# Patient Record
Sex: Male | Born: 1995 | Race: White | Hispanic: No | Marital: Single | Smoking: Former smoker
Health system: Southern US, Community
[De-identification: ages and names within clinical notes are randomized; demographics above are authoritative.]

## PROBLEM LIST (undated history)

## (undated) ENCOUNTER — Ambulatory Visit: Payer: BC Managed Care – PPO | Source: Home / Self Care

## (undated) DIAGNOSIS — F419 Anxiety disorder, unspecified: Secondary | ICD-10-CM

## (undated) DIAGNOSIS — F10232 Alcohol dependence with withdrawal with perceptual disturbance: Secondary | ICD-10-CM

## (undated) DIAGNOSIS — R011 Cardiac murmur, unspecified: Secondary | ICD-10-CM

## (undated) DIAGNOSIS — F909 Attention-deficit hyperactivity disorder, unspecified type: Secondary | ICD-10-CM

## (undated) DIAGNOSIS — R159 Full incontinence of feces: Secondary | ICD-10-CM

## (undated) DIAGNOSIS — F10259 Alcohol dependence with alcohol-induced psychotic disorder, unspecified: Secondary | ICD-10-CM

## (undated) DIAGNOSIS — F1994 Other psychoactive substance use, unspecified with psychoactive substance-induced mood disorder: Secondary | ICD-10-CM

## (undated) DIAGNOSIS — I350 Nonrheumatic aortic (valve) stenosis: Secondary | ICD-10-CM

## (undated) HISTORY — DX: Cardiac murmur, unspecified: R01.1

## (undated) HISTORY — PX: NASAL SEPTUM SURGERY: SHX37

## (undated) HISTORY — PX: CARDIAC SURGERY: SHX584

## (undated) HISTORY — DX: Full incontinence of feces: R15.9

## (undated) HISTORY — DX: Nonrheumatic aortic (valve) stenosis: I35.0

---
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
## 2016-01-25 DIAGNOSIS — Z952 Presence of prosthetic heart valve: Secondary | ICD-10-CM | POA: Diagnosis not present

## 2016-03-01 DIAGNOSIS — F4310 Post-traumatic stress disorder, unspecified: Secondary | ICD-10-CM | POA: Diagnosis not present

## 2016-03-01 DIAGNOSIS — F909 Attention-deficit hyperactivity disorder, unspecified type: Secondary | ICD-10-CM | POA: Diagnosis not present

## 2016-03-01 DIAGNOSIS — F411 Generalized anxiety disorder: Secondary | ICD-10-CM | POA: Diagnosis not present

## 2016-03-01 DIAGNOSIS — F331 Major depressive disorder, recurrent, moderate: Secondary | ICD-10-CM | POA: Diagnosis not present

## 2016-03-22 DIAGNOSIS — Z952 Presence of prosthetic heart valve: Secondary | ICD-10-CM | POA: Diagnosis not present

## 2016-04-03 DIAGNOSIS — Z952 Presence of prosthetic heart valve: Secondary | ICD-10-CM | POA: Diagnosis not present

## 2016-04-03 DIAGNOSIS — I359 Nonrheumatic aortic valve disorder, unspecified: Secondary | ICD-10-CM | POA: Diagnosis not present

## 2016-04-03 DIAGNOSIS — R002 Palpitations: Secondary | ICD-10-CM | POA: Diagnosis not present

## 2016-04-18 DIAGNOSIS — Z952 Presence of prosthetic heart valve: Secondary | ICD-10-CM | POA: Diagnosis not present

## 2016-06-13 DIAGNOSIS — I359 Nonrheumatic aortic valve disorder, unspecified: Secondary | ICD-10-CM | POA: Diagnosis not present

## 2016-07-12 DIAGNOSIS — F411 Generalized anxiety disorder: Secondary | ICD-10-CM | POA: Diagnosis not present

## 2016-07-12 DIAGNOSIS — F909 Attention-deficit hyperactivity disorder, unspecified type: Secondary | ICD-10-CM | POA: Diagnosis not present

## 2016-07-12 DIAGNOSIS — F331 Major depressive disorder, recurrent, moderate: Secondary | ICD-10-CM | POA: Diagnosis not present

## 2016-07-12 DIAGNOSIS — F4310 Post-traumatic stress disorder, unspecified: Secondary | ICD-10-CM | POA: Diagnosis not present

## 2016-09-13 DIAGNOSIS — I359 Nonrheumatic aortic valve disorder, unspecified: Secondary | ICD-10-CM | POA: Diagnosis not present

## 2016-09-17 ENCOUNTER — Encounter (HOSPITAL_COMMUNITY): Payer: Self-pay

## 2016-09-17 ENCOUNTER — Emergency Department (HOSPITAL_COMMUNITY): Payer: BLUE CROSS/BLUE SHIELD

## 2016-09-17 ENCOUNTER — Emergency Department (HOSPITAL_COMMUNITY)
Admission: EM | Admit: 2016-09-17 | Discharge: 2016-09-18 | Disposition: A | Discharge status: Home / Self Care | Payer: BLUE CROSS/BLUE SHIELD | Attending: Emergency Medicine | Admitting: Emergency Medicine

## 2016-09-17 DIAGNOSIS — Z7901 Long term (current) use of anticoagulants: Secondary | ICD-10-CM | POA: Diagnosis not present

## 2016-09-17 DIAGNOSIS — Z79899 Other long term (current) drug therapy: Secondary | ICD-10-CM | POA: Insufficient documentation

## 2016-09-17 DIAGNOSIS — R0602 Shortness of breath: Secondary | ICD-10-CM | POA: Insufficient documentation

## 2016-09-17 DIAGNOSIS — R079 Chest pain, unspecified: Secondary | ICD-10-CM | POA: Diagnosis not present

## 2016-09-17 DIAGNOSIS — R05 Cough: Secondary | ICD-10-CM | POA: Diagnosis not present

## 2016-09-17 DIAGNOSIS — F17210 Nicotine dependence, cigarettes, uncomplicated: Secondary | ICD-10-CM | POA: Diagnosis not present

## 2016-09-17 DIAGNOSIS — R002 Palpitations: Secondary | ICD-10-CM

## 2016-09-17 DIAGNOSIS — R200 Anesthesia of skin: Secondary | ICD-10-CM | POA: Insufficient documentation

## 2016-09-17 LAB — CBC
HCT: 42.7 % (ref 39.0–52.0)
Hemoglobin: 15.4 g/dL (ref 13.0–17.0)
MCH: 31.7 pg (ref 26.0–34.0)
MCHC: 36.1 g/dL — ABNORMAL HIGH (ref 30.0–36.0)
MCV: 87.9 fL (ref 78.0–100.0)
PLATELETS: 274 10*3/uL (ref 150–400)
RBC: 4.86 MIL/uL (ref 4.22–5.81)
RDW: 12.6 % (ref 11.5–15.5)
WBC: 10.1 10*3/uL (ref 4.0–10.5)

## 2016-09-17 LAB — BASIC METABOLIC PANEL
ANION GAP: 10 (ref 5–15)
BUN: 8 mg/dL (ref 6–20)
CALCIUM: 9.7 mg/dL (ref 8.9–10.3)
CO2: 24 mmol/L (ref 22–32)
CREATININE: 1.03 mg/dL (ref 0.61–1.24)
Chloride: 104 mmol/L (ref 101–111)
Glucose, Bld: 81 mg/dL (ref 65–99)
Potassium: 4 mmol/L (ref 3.5–5.1)
SODIUM: 138 mmol/L (ref 135–145)

## 2016-09-17 LAB — I-STAT TROPONIN, ED: TROPONIN I, POC: 0 ng/mL (ref 0.00–0.08)

## 2016-09-17 LAB — PROTIME-INR
INR: 2.2
Prothrombin Time: 24.8 seconds — ABNORMAL HIGH (ref 11.4–15.2)

## 2016-09-17 NOTE — ED Triage Notes (Signed)
Pt states he was at work and began having palpitations. Pt also reports some chest pain on the way to the hospital. 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 Skin warm and dry, no distress noted.

## 2016-09-17 NOTE — ED Notes (Signed)
MD at bedside. 

## 2016-09-17 NOTE — ED Provider Notes (Signed)
[facility] Provider Note   CSN: [id number] Arrival date & time: 09/17/16  1747  By signing my name below, I, Ny'Kea Lewis, attest that this documentation has been prepared under the direction and in the presence of Danial Sisley, MD. Electronically Signed: Karren CobbleNy'Kea Lewis, ED Scribe. 09/17/16. 11:36 PM.  History   Chief Complaint Chief Complaint  Patient presents with  . Palpitations   The history is provided by the patient and a parent. No language interpreter was used.  Palpitations   This is a recurrent problem. The current episode started 6 to 12 hours ago. The problem occurs rarely. The problem has not changed since onset.The problem is associated with an unknown factor. Associated symptoms include numbness and shortness of breath. Pertinent negatives include no diaphoresis and no weakness. He has tried nothing for the symptoms. The treatment provided significant relief. Risk factors include being male.    HPI HPI Comments: Adam Bray is a 21 y.o. male with a PMHx of aortic stenosis, heart murmur, and anxiety who presents to the Emergency Department complaining of sudden onset, persistent palpitations that began at 5 pm. He notes associated shortness of breath, cough, and numbness in his bilateral upper extremities. Pt reports he was at work leaving a meeting when he began to experience palpitations. He reports having palpitations intermittently for the past three weeks but this episode was not similar. He describes the palpitations felt like "I was getting th blood sucked out of me ". No h/o PE/DVT or recent long travel. Pt reports at this time all his symptoms have resolved. Denies rash, diaphoresis, or any acute associated symptoms.   Past Medical History:  Diagnosis Date  . ADHD (attention deficit hyperactivity disorder)   . 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 Anxiety   .  Aortic stenosis   . Constipation   . 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 Heart murmur   . Substance induced mood disorder (HCC) 10/04/2014    Patient Active Problem List   Diagnosis Date Noted  . 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 Substance induced mood disorder (HCC) 10/04/2014  . MDD (major depressive disorder), recurrent episode, moderate (HCC) 10/04/2014   Past Surgical History:  Procedure Laterality Date  . CARDIAC SURGERY    . NASAL SEPTUM SURGERY      Home Medications    Prior to Admission medications   Medication Sig Start Date End Date Taking? Authorizing Provider  acetaminophen (TYLENOL) 500 MG tablet Take 1,000 mg by mouth daily as needed for headache.    [provider]  cetirizine (ZYRTEC) 10 MG tablet Take 10 mg by mouth daily as needed for allergies.     [provider]  guanFACINE (INTUNIV) 1 MG TB24 Take 3 mg by mouth at bedtime.    [provider]  hydrOXYzine (ATARAX/VISTARIL) 25 MG tablet Take 25 mg by mouth at bedtime.     [provider]  mirtazapine (REMERON) 30 MG tablet Take 30 mg by mouth at bedtime.  04/18/10   [provider]  warfarin (COUMADIN) 2.5 MG tablet Take 2.5 mg by mouth at bedtime.    [provider]   Family History Family History  Problem Relation Age of Onset  . Hirschsprung's disease Neg Hx    Social History Social History  Substance Use Topics  . Smoking status: Current Every Day Smoker  Packs/day: 0.25    Types: Cigarettes  . Smokeless tobacco: Never Used  . Alcohol use Yes    Allergies   Patient has no known allergies.  Review of Systems Review of Systems  Constitutional: Negative for diaphoresis.  Respiratory: Positive for shortness of breath.   Cardiovascular: Positive for palpitations.  Skin: Negative for rash.  Neurological: Positive for numbness. Negative for tremors,  seizures, syncope, speech difficulty and weakness.  All other systems reviewed and are negative.  Physical Exam Updated Vital Signs BP 123/78 (BP Location: Right Arm)   Pulse 85   Temp 98.3 F (36.8 C) (Oral)   Resp 18   Ht 5\' 10"  (1.778 m)   Wt 215 lb (97.5 kg)   SpO2 97%   BMI 30.85 kg/m   Physical Exam  Constitutional: He is oriented to person, place, and time. He appears well-developed and well-nourished.  HENT:  Head: Normocephalic.  Mouth/Throat: Oropharynx is clear and moist. No oropharyngeal exudate.  Eyes: Pupils are equal, round, and reactive to light. Conjunctivae and EOM are normal. Right eye exhibits no discharge. Left eye exhibits no discharge. No scleral icterus.  Neck: Normal range of motion. Neck supple. No JVD present. No tracheal deviation present.  Trachea is midline. No stridor or carotid bruits.  Cardiovascular: Normal rate, regular rhythm, normal heart sounds and intact distal pulses.   No murmur heard. Pulmonary/Chest: Effort normal and breath sounds normal. No stridor. No respiratory distress. He has no wheezes. He has no rales.  Lungs CTA bilaterally.  Abdominal: Soft. Bowel sounds are normal. He exhibits no distension. There is no tenderness. There is no rebound and no guarding.  Musculoskeletal: Normal range of motion. He exhibits no edema or tenderness.  All compartments are soft. No palpable cords.   Lymphadenopathy:    He has no cervical adenopathy.  Neurological: He is alert and oriented to person, place, and time. He has normal reflexes. He displays normal reflexes.  Skin: Skin is warm and dry. Capillary refill takes less than 2 seconds.  Psychiatric: He has a normal mood and affect. His behavior is normal.  Nursing note and vitals reviewed.  ED Treatments / Results  DIAGNOSTIC STUDIES: Oxygen Saturation is 97% on RA, adequate  by my interpretation.   COORDINATION OF CARE: 11:30 PM-Discussed next steps with pt. Pt verbalized understanding  and is agreeable with the plan.   Labs (all labs ordered are listed, but only abnormal results are displayed)  Results for orders placed or performed during the hospital encounter of 09/17/16  Basic metabolic panel  Result Value Ref Range   Sodium 138 135 - 145 mmol/L   Potassium 4.0 3.5 - 5.1 mmol/L   Chloride 104 101 - 111 mmol/L   CO2 24 22 - 32 mmol/L   Glucose, Bld 81 65 - 99 mg/dL   BUN 8 6 - 20 mg/dL   Creatinine, Ser 4.541.03 0.61 - 1.24 mg/dL   Calcium 9.7 8.9 - 09.810.3 mg/dL   GFR calc non Af Amer >60 >60 mL/min   GFR calc Af Amer >60 >60 mL/min   Anion gap 10 5 - 15  CBC  Result Value Ref Range   WBC 10.1 4.0 - 10.5 K/uL   RBC 4.86 4.22 - 5.81 MIL/uL   Hemoglobin 15.4 13.0 - 17.0 g/dL   HCT 11.942.7 14.739.0 - 82.952.0 %   MCV 87.9 78.0 - 100.0 fL   MCH 31.7 26.0 - 34.0 pg   MCHC 36.1 (H) 30.0 - 36.0 g/dL  RDW 12.6 11.5 - 15.5 %   Platelets 274 150 - 400 K/uL  Protime-INR (order if Patient is taking Coumadin / Warfarin)  Result Value Ref Range   Prothrombin Time 24.8 (H) 11.4 - 15.2 seconds   INR 2.20   I-stat troponin, ED  Result Value Ref Range   Troponin i, poc 0.00 0.00 - 0.08 ng/mL   Comment 3           Dg Chest 2 View  Result Date: 09/17/2016 CLINICAL DATA:  21 year old male with chest pain. EXAM: CHEST  2 VIEW COMPARISON:  Chest radiograph dated 03/17/2013 FINDINGS: The lungs are clear. There is no pleural effusion or pneumothorax. The cardiac silhouette is within normal limits. Median sternotomy wires and aortic valve replacement noted. No acute osseous pathology. IMPRESSION: No active cardiopulmonary disease. Electronically Signed   By: Elgie Collard M.D.   On: 09/17/2016 18:59    EKG  Date: 09/17/2016  Rate: 83  Rhythm: normal sinus rhythm  QRS Axis: normal  Intervals: normal  ST/T Wave abnormalities: normal  Conduction Disutrbances: none  Narrative Interpretation: unremarkable     Procedures Procedures (including critical care time)    Final  Clinical Impressions(s) / ED Diagnoses  Palpitations:  Strict return precautions given for weakness, difficulty ambulating, inability to tolerate oral medication,  Dyspnea on exertion, fevers, vomiting, decreased urine output, leg swelling, syncope or any concerns. No signs of systemic illness or infection. The patient is nontoxic-appearing on exam and vital signs are within normal limits. Follow up with your cardiologist.  I have reviewed the triage vital signs and the nursing notes. Pertinent labs &imaging results that were available during my care of the patient were reviewed by me and considered in my medical decision making (see chart for details).  After history, exam, and medical workup I feel the patient has been appropriately medically screened and is safe for discharge home. Pertinent diagnoses were discussed with the patient. Patient was given return precautions.  I personally performed the services described in this documentation, which was scribed in my presence. The recorded information has been reviewed and is accurate.       Charelle Petrakis, MD 09/17/16 2345

## 2016-09-18 ENCOUNTER — Encounter (HOSPITAL_COMMUNITY): Payer: Self-pay | Admitting: Emergency Medicine

## 2016-09-18 DIAGNOSIS — R002 Palpitations: Secondary | ICD-10-CM | POA: Diagnosis not present

## 2016-09-18 LAB — I-STAT TROPONIN, ED: Troponin i, poc: 0.01 ng/mL (ref 0.00–0.08)

## 2016-09-18 NOTE — ED Notes (Signed)
Patient verbalized understanding of discharge instructions and denies any further needs or questions at this time. VS stable. Patient ambulatory with steady gait.  

## 2016-11-08 DIAGNOSIS — I6529 Occlusion and stenosis of unspecified carotid artery: Secondary | ICD-10-CM | POA: Diagnosis not present

## 2016-11-22 DIAGNOSIS — F411 Generalized anxiety disorder: Secondary | ICD-10-CM | POA: Diagnosis not present

## 2016-11-22 DIAGNOSIS — F331 Major depressive disorder, recurrent, moderate: Secondary | ICD-10-CM | POA: Diagnosis not present

## 2016-11-22 DIAGNOSIS — F4310 Post-traumatic stress disorder, unspecified: Secondary | ICD-10-CM | POA: Diagnosis not present

## 2016-11-22 DIAGNOSIS — F909 Attention-deficit hyperactivity disorder, unspecified type: Secondary | ICD-10-CM | POA: Diagnosis not present

## 2016-11-29 ENCOUNTER — Encounter (HOSPITAL_COMMUNITY): Payer: Self-pay | Admitting: Emergency Medicine

## 2016-11-29 ENCOUNTER — Ambulatory Visit (INDEPENDENT_AMBULATORY_CARE_PROVIDER_SITE_OTHER): Payer: BLUE CROSS/BLUE SHIELD

## 2016-11-29 ENCOUNTER — Ambulatory Visit (HOSPITAL_COMMUNITY)
Admission: EM | Admit: 2016-11-29 | Discharge: 2016-11-29 | Disposition: A | Discharge status: Home / Self Care | Payer: BLUE CROSS/BLUE SHIELD | Attending: Family Medicine | Admitting: Family Medicine

## 2016-11-29 DIAGNOSIS — S61012A Laceration without foreign body of left thumb without damage to nail, initial encounter: Secondary | ICD-10-CM | POA: Diagnosis not present

## 2016-11-29 DIAGNOSIS — S60419A Abrasion of unspecified finger, initial encounter: Secondary | ICD-10-CM | POA: Diagnosis not present

## 2016-11-29 DIAGNOSIS — R42 Dizziness and giddiness: Secondary | ICD-10-CM | POA: Diagnosis not present

## 2016-11-29 DIAGNOSIS — Z23 Encounter for immunization: Secondary | ICD-10-CM

## 2016-11-29 DIAGNOSIS — W268XXA Contact with other sharp object(s), not elsewhere classified, initial encounter: Secondary | ICD-10-CM

## 2016-11-29 LAB — GLUCOSE, CAPILLARY: GLUCOSE-CAPILLARY: 91 mg/dL (ref 65–99)

## 2016-11-29 MED ORDER — TETANUS-DIPHTH-ACELL PERTUSSIS 5-2.5-18.5 LF-MCG/0.5 IM SUSP
INTRAMUSCULAR | Status: AC
  Filled 2016-11-29: qty 0.5

## 2016-11-29 MED ORDER — TETANUS-DIPHTH-ACELL PERTUSSIS 5-2.5-18.5 LF-MCG/0.5 IM SUSP
0.5000 mL | Freq: Once | INTRAMUSCULAR | Status: AC
  Administered 2016-11-29: 0.5 mL via INTRAMUSCULAR

## 2016-11-29 NOTE — ED Triage Notes (Addendum)
Laceration to left thumb that occurred today.  Was trying to assist in putting a tarp on a roof, thumb was either cut by nail or siding.  Says a neighbor did remove a piece of metal from thumb.  Unknown when last tetanus was.

## 2016-11-29 NOTE — ED Notes (Signed)
Provided coke, peanut butter and crackers to patient

## 2016-11-29 NOTE — Discharge Instructions (Signed)
Tetanus injection today. Wound dressing daily. Monitor for any worsening of symptoms, spreading redness, increased warmth, drainage, follow up here or with PCP for further evaluation.

## 2016-11-29 NOTE — ED Provider Notes (Signed)
[facility]    CSN: [id number] Arrival date & time: 11/29/16  1755     History   Chief Complaint Chief Complaint  Patient presents with  . Laceration    HPI Adam Bray is a 21 y.o. male.   21 year old male with history of aortic stenosis s/p valve replacement 2014 on warfarin, anxiety, alcohol induced psychotic disorder, substance induced mood disorder, MDD, comes in for few hour history of laceration to left thumb. He was trying to assist in putting a tarp on the roof when thumb was cut. States a piece of metal was removed from the thumb, but feels that there could be more foreign body in the wound. Unknown last tetanus injection. Denies numbness/tingling, fever, chills.       Past Medical History:  Diagnosis Date  . ADHD (attention deficit hyperactivity disorder)   . 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 Anxiety   . Aortic stenosis   . Constipation   . 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 Heart murmur   . Substance induced mood disorder (HCC) 10/04/2014    Patient Active Problem List   Diagnosis Date Noted  . 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 Substance induced mood disorder (HCC) 10/04/2014  . MDD (major depressive disorder), recurrent episode, moderate (HCC) 10/04/2014    Past Surgical History:  Procedure Laterality Date  . CARDIAC SURGERY    . NASAL SEPTUM SURGERY         Home Medications    Prior to Admission medications   Medication Sig Start Date End Date Taking? Authorizing Provider  GuanFACINE HCl (INTUNIV) 3 MG TB24 Take 3 mg by mouth at bedtime.    [provider]  mirtazapine (REMERON) 45 MG tablet Take 45 mg by mouth at bedtime.    [provider]  warfarin (COUMADIN) 3 MG tablet Take 3 mg by mouth See admin instructions. On Sunday,  Tuesday, Thursday and Saturday    [provider]  warfarin (COUMADIN) 4 MG tablet Take 4 mg by mouth every Monday, Wednesday, and Friday.    [provider]    Family History Family History  Problem Relation Age of Onset  . Hirschsprung's disease Neg Hx     Social History Social History  Substance Use Topics  . Smoking status: Current Every Day Smoker    Packs/day: 0.25    Types: Cigarettes  . Smokeless tobacco: Never Used  . Alcohol use Yes     Allergies   Patient has no known allergies.   Review of Systems Review of Systems  Reason unable to perform ROS: See HPI as above.     Physical Exam Triage Vital Signs ED Triage Vitals  Enc Vitals Group     BP 11/29/16 1806 127/76     Pulse Rate 11/29/16 1806 87     Resp 11/29/16 1806 20     Temp 11/29/16 1806 98.5 F (36.9 C)     Temp Source 11/29/16 1806 Oral     SpO2 11/29/16 1806 97 %     Weight --      Height --      Head Circumference --      Peak Flow --      Pain Score 11/29/16 1804 2     Pain Loc --      Pain Edu? --  Excl. in GC? --    No data found.   Updated Vital Signs BP 127/76 (BP Location: Right Arm)   Pulse 87   Temp 98.5 F (36.9 C) (Oral)   Resp 20   SpO2 97%   Physical Exam  Constitutional: He is oriented to person, place, and time. He appears well-developed and well-nourished. No distress.  HENT:  Head: Normocephalic and atraumatic.  Eyes: Pupils are equal, round, and reactive to light. Conjunctivae are normal.  Musculoskeletal:  1.5 cm x 0.5 cm abrasion on left thumb flexor surface of DIP joint. Tenderness on palpation. Full ROM of thumb. Sensation intact. Cap refill <2s.   See picture below.   Neurological: He is alert and oriented to person, place, and time.        UC Treatments / Results  Labs (all labs ordered are listed, but only abnormal results are displayed) Labs Reviewed  GLUCOSE, CAPILLARY    EKG  EKG Interpretation None        Radiology Dg Finger Thumb Left  Result Date: 11/29/2016 CLINICAL DATA:  Was trying a move a downed tree and caught LEFT thumb on a nail, laceration, question foreign body EXAM: LEFT THUMB 2+V COMPARISON:  LEFT hand radiographs 07/10/2008 FINDINGS: Osseous mineralization normal. Joint spaces preserved. No acute fracture, dislocation or bone destruction. No radiopaque foreign body. IMPRESSION: No acute abnormalities. Electronically Signed   By: Ulyses Southward M.D.   On: 11/29/2016 18:43    Procedures Procedures (including critical care time)  Medications Ordered in UC Medications  Tdap (BOOSTRIX) injection 0.5 mL (0.5 mLs Intramuscular Given 11/29/16 1908)     Initial Impression / Assessment and Plan / UC Course  I have reviewed the triage vital signs and the nursing notes.  Pertinent labs & imaging results that were available during my care of the patient were reviewed by me and considered in my medical decision making (see chart for details).  Clinical Course as of Nov 30 1919  Thu Nov 29, 2016  1610 Patient feeling faint after cleaning of wound and dressing. Pale with diaphoresis. BP 94/55, HR 63. Patient states last intake of food 1pm with cereal. Has not had anything else to eat today. CBG 91. Mother states patient can get weak/dizzy after seeing blood due to past experience. Food and drink provided, patient is feeling better. Will recheck vitals after food/drink intake  [AY]    Clinical Course User Index [AY] Belinda Fisher, PA-C   Patient feeling better after food and drink intake. Xray without metal foreign body noted. Extensive irrigation with saline, no obvious foreign body seen. Given warfarin use, will apply surgicel to help control bleeding. Wound dressed. Wound care instructions given. Tetanus injection in office today. Return precautions given.     Final Clinical Impressions(s) / UC Diagnoses   Final diagnoses:  Abrasion of finger, initial encounter    New  Prescriptions New Prescriptions   No medications on file      Lurline Idol 11/29/16 1920    Belinda Fisher, PA-C 11/29/16 1922

## 2016-11-29 NOTE — ED Notes (Signed)
Soaking left thumb in saline/betadine

## 2017-01-19 DIAGNOSIS — I359 Nonrheumatic aortic valve disorder, unspecified: Secondary | ICD-10-CM | POA: Diagnosis not present

## 2017-02-09 DIAGNOSIS — I359 Nonrheumatic aortic valve disorder, unspecified: Secondary | ICD-10-CM | POA: Diagnosis not present

## 2017-02-26 DIAGNOSIS — F331 Major depressive disorder, recurrent, moderate: Secondary | ICD-10-CM | POA: Diagnosis not present

## 2017-02-26 DIAGNOSIS — F4310 Post-traumatic stress disorder, unspecified: Secondary | ICD-10-CM | POA: Diagnosis not present

## 2017-02-26 DIAGNOSIS — F411 Generalized anxiety disorder: Secondary | ICD-10-CM | POA: Diagnosis not present

## 2017-02-26 DIAGNOSIS — I359 Nonrheumatic aortic valve disorder, unspecified: Secondary | ICD-10-CM | POA: Diagnosis not present

## 2017-02-26 DIAGNOSIS — F909 Attention-deficit hyperactivity disorder, unspecified type: Secondary | ICD-10-CM | POA: Diagnosis not present

## 2017-03-09 DIAGNOSIS — I359 Nonrheumatic aortic valve disorder, unspecified: Secondary | ICD-10-CM | POA: Diagnosis not present

## 2017-04-03 DIAGNOSIS — Z952 Presence of prosthetic heart valve: Secondary | ICD-10-CM | POA: Diagnosis not present

## 2017-05-04 DIAGNOSIS — I359 Nonrheumatic aortic valve disorder, unspecified: Secondary | ICD-10-CM | POA: Diagnosis not present

## 2017-05-05 ENCOUNTER — Emergency Department (HOSPITAL_COMMUNITY)
Admission: EM | Admit: 2017-05-05 | Discharge: 2017-05-05 | Disposition: A | Discharge status: Home / Self Care | Payer: BLUE CROSS/BLUE SHIELD | Attending: Physician Assistant | Admitting: Physician Assistant

## 2017-05-05 ENCOUNTER — Encounter (HOSPITAL_COMMUNITY): Payer: Self-pay | Admitting: Emergency Medicine

## 2017-05-05 ENCOUNTER — Other Ambulatory Visit: Payer: Self-pay

## 2017-05-05 ENCOUNTER — Emergency Department (HOSPITAL_COMMUNITY): Payer: BLUE CROSS/BLUE SHIELD

## 2017-05-05 DIAGNOSIS — F17210 Nicotine dependence, cigarettes, uncomplicated: Secondary | ICD-10-CM | POA: Insufficient documentation

## 2017-05-05 DIAGNOSIS — R002 Palpitations: Secondary | ICD-10-CM | POA: Diagnosis not present

## 2017-05-05 DIAGNOSIS — Z7901 Long term (current) use of anticoagulants: Secondary | ICD-10-CM | POA: Insufficient documentation

## 2017-05-05 DIAGNOSIS — R079 Chest pain, unspecified: Secondary | ICD-10-CM | POA: Diagnosis not present

## 2017-05-05 LAB — I-STAT TROPONIN, ED: Troponin i, poc: 0.01 ng/mL (ref 0.00–0.08)

## 2017-05-05 LAB — TSH: TSH: 2.782 u[IU]/mL (ref 0.350–4.500)

## 2017-05-05 LAB — CBC
HCT: 43.9 % (ref 39.0–52.0)
Hemoglobin: 15.6 g/dL (ref 13.0–17.0)
MCH: 32.2 pg (ref 26.0–34.0)
MCHC: 35.5 g/dL (ref 30.0–36.0)
MCV: 90.5 fL (ref 78.0–100.0)
PLATELETS: 257 10*3/uL (ref 150–400)
RBC: 4.85 MIL/uL (ref 4.22–5.81)
RDW: 12.7 % (ref 11.5–15.5)
WBC: 10 10*3/uL (ref 4.0–10.5)

## 2017-05-05 LAB — PROTIME-INR
INR: 2.51
PROTHROMBIN TIME: 26.9 s — AB (ref 11.4–15.2)

## 2017-05-05 LAB — BASIC METABOLIC PANEL
Anion gap: 11 (ref 5–15)
BUN: 10 mg/dL (ref 6–20)
CHLORIDE: 105 mmol/L (ref 101–111)
CO2: 23 mmol/L (ref 22–32)
CREATININE: 1.04 mg/dL (ref 0.61–1.24)
Calcium: 9.3 mg/dL (ref 8.9–10.3)
GFR calc non Af Amer: >60 mL/min (ref >60)
Glucose, Bld: 104 mg/dL — ABNORMAL HIGH (ref 65–99)
Potassium: 3.6 mmol/L (ref 3.5–5.1)
SODIUM: 139 mmol/L (ref 135–145)

## 2017-05-05 NOTE — ED Notes (Signed)
Pt has hx of valve replacement 3 years ago, Aortic valve-- congenital defect--  Has had palpitations and heart "racing" since last Friday- not drinking energy drinks/caffienated beverages, no antihistamines-- saw private cardiologist yesterday for reg follow up.

## 2017-05-05 NOTE — ED Triage Notes (Addendum)
Reports having left chest pressure that has been intermittent for the last week.  Also feels like his heart is racing.  Reports feeling sob, dizzy, and some nausea.  Patient reports having a mechanical valve and takes coumadin.

## 2017-05-05 NOTE — Discharge Instructions (Signed)
Please follow up with your doctor °Return if worsening °

## 2017-05-05 NOTE — ED Provider Notes (Signed)
[facility] EMERGENCY DEPARTMENT Provider Note   CSN: [id number] Arrival date & time: 05/05/17  0055     History   Chief Complaint Chief Complaint  Patient presents with  . Chest Pain  . Tachycardia    HPI Adam Bray is a 22 y.o. male who presents with feeling of his heart racing.  Past medical history significant for alcohol abuse, anxiety, congenital aortic stenosis status post mechanical valve replacement on chronic anticoagulation.  Patient states that over the past week he has had intermittent feelings of his heart racing, chest and throat pressure, and shortness of breath.  Last night the feeling was more persistent and occurred more often so he decided to come to the ED.  He denies any aggravating or alleviating factors.  He has had this before and had to wear a Holter monitor but states that it would not stick to him so he did not have this done.  His prior palpitations resolved on their own.  He states that he did smoke marijuana last month and had palpitations at that time but he has not smoked recently.  He is not drinking excessive caffeine or energy drinks or taking any over-the-counter medicines.  He denies fever or recent illness, chest pain, lightheadedness, syncope, abdominal pain, leg pain or swelling.  He currently feels asymptomatic  HPI  Past Medical History:  Diagnosis Date  . ADHD (attention deficit hyperactivity disorder)   . 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 Anxiety   . Aortic stenosis   . Constipation   . 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 Heart murmur   . Substance induced mood disorder (HCC) 10/04/2014    Patient Active Problem List   Diagnosis Date Noted  . 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 Substance induced mood disorder (HCC) 10/04/2014  . MDD  (major depressive disorder), recurrent episode, moderate (HCC) 10/04/2014    Past Surgical History:  Procedure Laterality Date  . CARDIAC SURGERY    . NASAL SEPTUM SURGERY       Home Medications    Prior to Admission medications   Medication Sig Start Date End Date Taking? Authorizing Provider  GuanFACINE HCl (INTUNIV) 3 MG TB24 Take 3 mg by mouth at bedtime.    [provider]  mirtazapine (REMERON) 45 MG tablet Take 45 mg by mouth at bedtime.    [provider]  warfarin (COUMADIN) 3 MG tablet Take 3 mg by mouth See admin instructions. On Sunday, Tuesday, Thursday and Saturday    [provider]  warfarin (COUMADIN) 4 MG tablet Take 4 mg by mouth every Monday, Wednesday, and Friday.    [provider]    Family History Family History  Problem Relation Age of Onset  . Hirschsprung's disease Neg Hx     Social History Social History   Tobacco Use  . Smoking status: Current Every Day Smoker    Packs/day: 0.25    Types: Cigarettes  . Smokeless tobacco: Never Used  Substance Use Topics  . Alcohol use: Yes    Comment: occasionally  . Drug use: No     Allergies   Patient has no known allergies.   Review of Systems Review of Systems  Constitutional: Negative for fever.  Respiratory: Positive for shortness of breath (Resolved). Negative for cough and wheezing.   Cardiovascular: Positive for chest pain (Pressure, resolved)  and palpitations. Negative for leg swelling.  Gastrointestinal: Negative for abdominal pain, nausea and vomiting.  Neurological: Negative for syncope and light-headedness.  All other systems reviewed and are negative.    Physical Exam Updated Vital Signs BP 133/61 (BP Location: Right Arm)   Pulse 94   Temp 98.7 F (37.1 C) (Oral)   Resp 18   Ht 5\' 10"  (1.778 m)   Wt 95.3 kg (210 lb)   SpO2 100%   BMI 30.13 kg/m   Physical Exam  Constitutional: He is oriented to person, place, and time. He appears  well-developed and well-nourished. No distress.  HENT:  Head: Normocephalic and atraumatic.  Eyes: Conjunctivae are normal. Pupils are equal, round, and reactive to light. Right eye exhibits no discharge. Left eye exhibits no discharge. No scleral icterus.  Neck: Normal range of motion.  Cardiovascular: Normal rate and regular rhythm.  Murmur heard. Surgical scar  Pulmonary/Chest: Effort normal and breath sounds normal. No respiratory distress.  Abdominal: Soft. Bowel sounds are normal. He exhibits no distension. There is no tenderness.  Neurological: He is alert and oriented to person, place, and time.  Skin: Skin is warm and dry.  Psychiatric: He has a normal mood and affect. His behavior is normal.  Nursing note and vitals reviewed.    ED Treatments / Results  Labs (all labs ordered are listed, but only abnormal results are displayed) Labs Reviewed  BASIC METABOLIC PANEL - Abnormal; Notable for the following components:      Result Value   Glucose, Bld 104 (*)    All other components within normal limits  PROTIME-INR - Abnormal; Notable for the following components:   Prothrombin Time 26.9 (*)    All other components within normal limits  CBC  TSH  I-STAT TROPONIN, ED    EKG  EKG Interpretation  Date/Time:  Sunday May 05 2017 01:10:57 EDT Ventricular Rate:  90 PR Interval:  162 QRS Duration: 94 QT Interval:  344 QTC Calculation: 420 R Axis:   82 Text Interpretation:  Normal sinus rhythm Nonspecific T wave abnormality Abnormal ECG Normal sinus rhythm Confirmed by Bary CastillaMackuen, Courteney ([id number]) on 05/05/2017 10:32:38 AM       Radiology Dg Chest 2 View  Result Date: 05/05/2017 CLINICAL DATA:  Chest pain. EXAM: CHEST - 2 VIEW COMPARISON:  Radiograph 09/17/2016 FINDINGS: Post median sternotomy with prosthetic aortic valve. The cardiomediastinal contours are normal. The lungs are clear. Pulmonary vasculature is normal. No consolidation, pleural effusion, or pneumothorax.  No acute osseous abnormalities are seen. IMPRESSION: No acute pulmonary process. Electronically Signed   By: Rubye OaksMelanie  Ehinger M.D.   On: 05/05/2017 02:32    Procedures Procedures (including critical care time)  Medications Ordered in ED Medications - No data to display   Initial Impression / Assessment and Plan / ED Course  I have reviewed the triage vital signs and the nursing notes.  Pertinent labs & imaging results that were available during my care of the patient were reviewed by me and considered in my medical decision making (see chart for details).  22 year old male presents with palpitations, chest pressure, shortness of breath intermittently for the past week.  Unclear etiology.  He states that he was having the symptoms on the initial EKG was taken this morning.  This was normal sinus rhythm.  His CBC is normal.  His BMP does not have any electrolyte imbalances.  His troponin was normal.  His chest x-ray is normal.  His INR is therapeutic.  A TSH  was drawn.  He was advised to follow-up with his cardiologist for ongoing symptoms.  Final Clinical Impressions(s) / ED Diagnoses   Final diagnoses:  Palpitations    ED Discharge Orders    None       Bethel Born, PA-C 05/05/17 1229    Abelino Derrick, MD 05/06/17 2257

## 2017-05-17 ENCOUNTER — Encounter (HOSPITAL_COMMUNITY): Payer: Self-pay | Admitting: Family Medicine

## 2017-05-17 ENCOUNTER — Ambulatory Visit (HOSPITAL_COMMUNITY)
Admission: EM | Admit: 2017-05-17 | Discharge: 2017-05-17 | Disposition: A | Discharge status: Home / Self Care | Payer: BLUE CROSS/BLUE SHIELD | Attending: Family Medicine | Admitting: Family Medicine

## 2017-05-17 DIAGNOSIS — J40 Bronchitis, not specified as acute or chronic: Secondary | ICD-10-CM | POA: Diagnosis not present

## 2017-05-17 MED ORDER — AZITHROMYCIN 250 MG PO TABS: 250.0000 mg | ORAL_TABLET | Freq: Once | ORAL | 0 refills | Status: AC

## 2017-05-17 NOTE — ED Triage Notes (Signed)
Pt here for cough and congestion x 3 days. Denies fever. sts his lungs hurt.

## 2017-05-17 NOTE — ED Provider Notes (Signed)
[facility]    CSN: [id number] Arrival date & time: 05/17/17  1213     History   Chief Complaint Chief Complaint  Patient presents with  . Cough    HPI SEICHI KAUFHOLD is a 22 y.o. male.   Complains of productive cough with green sputum and low-grade fever.  Has a history of aortic valve replacement and is on chronic Coumadin.  Also history of tobacco use  HPI  Past Medical History:  Diagnosis Date  . ADHD (attention deficit hyperactivity disorder)   . 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 Anxiety   . Aortic stenosis   . Constipation   . 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 Heart murmur   . Substance induced mood disorder (HCC) 10/04/2014    Patient Active Problem List   Diagnosis Date Noted  . 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 Substance induced mood disorder (HCC) 10/04/2014  . MDD (major depressive disorder), recurrent episode, moderate (HCC) 10/04/2014    Past Surgical History:  Procedure Laterality Date  . CARDIAC SURGERY    . NASAL SEPTUM SURGERY         Home Medications    Prior to Admission medications   Medication Sig Start Date End Date Taking? Authorizing Provider  GuanFACINE HCl (INTUNIV) 3 MG TB24 Take 3 mg by mouth at bedtime.    [provider]  mirtazapine (REMERON) 45 MG tablet Take 45 mg by mouth at bedtime.    [provider]  warfarin (COUMADIN) 3 MG tablet Take 3 mg by mouth See admin instructions. On Sunday, Tuesday, Thursday and Saturday    [provider]  warfarin (COUMADIN) 4 MG tablet Take 4 mg by mouth every Monday, Wednesday, and Friday.    [provider]    Family History Family History  Problem Relation Age of Onset  . Hirschsprung's disease Neg Hx     Social History Social History   Tobacco Use    . Smoking status: Current Every Day Smoker    Packs/day: 0.25    Types: Cigarettes  . Smokeless tobacco: Never Used  Substance Use Topics  . Alcohol use: Yes    Comment: occasionally  . Drug use: No     Allergies   Patient has no known allergies.   Review of Systems Review of Systems   Physical Exam Triage Vital Signs ED Triage Vitals  Enc Vitals Group     BP 05/17/17 1255 107/68     Pulse Rate 05/17/17 1255 89     Resp 05/17/17 1255 18     Temp 05/17/17 1255 98.4 F (36.9 C)     Temp src --      SpO2 05/17/17 1255 96 %     Weight --      Height --      Head Circumference --      Peak Flow --      Pain Score 05/17/17 1254 5     Pain Loc --      Pain Edu? --      Excl. in GC? --    No data found.  Updated Vital Signs BP 107/68   Pulse 89   Temp 98.4 F (36.9 C)   Resp 18   SpO2 96%   Visual Acuity Right Eye Distance:   Left Eye Distance:   Bilateral Distance:  Right Eye Near:   Left Eye Near:    Bilateral Near:     Physical Exam   UC Treatments / Results  Labs (all labs ordered are listed, but only abnormal results are displayed) Labs Reviewed - No data to display  EKG None Radiology No results found.  Procedures Procedures (including critical care time)  Medications Ordered in UC Medications - No data to display   Initial Impression / Assessment and Plan / UC Course  I have reviewed the triage vital signs and the nursing notes.  Pertinent labs & imaging results that were available during my care of the patient were reviewed by me and considered in my medical decision making (see chart for details).     Bronchitis.  Green sputum with low-grade fever and smoker.  Will treat with Z-Pak and also recommended Mucinex DM for the cough.  Final Clinical Impressions(s) / UC Diagnoses   Final diagnoses:  None    ED Discharge Orders    None       Controlled Substance Prescriptions [place] Controlled Substance Registry consulted?  Not Applicable   Frederica KusterMiller, Jordis Repetto M, MD 05/17/17 1335

## 2017-05-21 DIAGNOSIS — F331 Major depressive disorder, recurrent, moderate: Secondary | ICD-10-CM | POA: Diagnosis not present

## 2017-05-21 DIAGNOSIS — F411 Generalized anxiety disorder: Secondary | ICD-10-CM | POA: Diagnosis not present

## 2017-05-21 DIAGNOSIS — F4310 Post-traumatic stress disorder, unspecified: Secondary | ICD-10-CM | POA: Diagnosis not present

## 2017-05-21 DIAGNOSIS — F909 Attention-deficit hyperactivity disorder, unspecified type: Secondary | ICD-10-CM | POA: Diagnosis not present

## 2017-06-05 DIAGNOSIS — R5383 Other fatigue: Secondary | ICD-10-CM | POA: Diagnosis not present

## 2017-06-05 DIAGNOSIS — Z7901 Long term (current) use of anticoagulants: Secondary | ICD-10-CM | POA: Diagnosis not present

## 2017-06-05 DIAGNOSIS — E559 Vitamin D deficiency, unspecified: Secondary | ICD-10-CM | POA: Diagnosis not present

## 2017-06-05 DIAGNOSIS — Z952 Presence of prosthetic heart valve: Secondary | ICD-10-CM | POA: Diagnosis not present

## 2017-06-05 DIAGNOSIS — Z0000 Encounter for general adult medical examination without abnormal findings: Secondary | ICD-10-CM | POA: Diagnosis not present

## 2017-07-06 DIAGNOSIS — I359 Nonrheumatic aortic valve disorder, unspecified: Secondary | ICD-10-CM | POA: Diagnosis not present

## 2017-07-22 DIAGNOSIS — Z0000 Encounter for general adult medical examination without abnormal findings: Secondary | ICD-10-CM | POA: Diagnosis not present

## 2017-07-22 DIAGNOSIS — E559 Vitamin D deficiency, unspecified: Secondary | ICD-10-CM | POA: Diagnosis not present

## 2017-08-15 DIAGNOSIS — F331 Major depressive disorder, recurrent, moderate: Secondary | ICD-10-CM | POA: Diagnosis not present

## 2017-08-15 DIAGNOSIS — F4310 Post-traumatic stress disorder, unspecified: Secondary | ICD-10-CM | POA: Diagnosis not present

## 2017-08-15 DIAGNOSIS — F411 Generalized anxiety disorder: Secondary | ICD-10-CM | POA: Diagnosis not present

## 2017-08-15 DIAGNOSIS — F909 Attention-deficit hyperactivity disorder, unspecified type: Secondary | ICD-10-CM | POA: Diagnosis not present

## 2017-08-17 DIAGNOSIS — I359 Nonrheumatic aortic valve disorder, unspecified: Secondary | ICD-10-CM | POA: Diagnosis not present

## 2017-09-21 DIAGNOSIS — I359 Nonrheumatic aortic valve disorder, unspecified: Secondary | ICD-10-CM | POA: Diagnosis not present

## 2017-10-31 DIAGNOSIS — M795 Residual foreign body in soft tissue: Secondary | ICD-10-CM | POA: Diagnosis not present

## 2017-10-31 DIAGNOSIS — E039 Hypothyroidism, unspecified: Secondary | ICD-10-CM | POA: Diagnosis not present

## 2017-10-31 DIAGNOSIS — E559 Vitamin D deficiency, unspecified: Secondary | ICD-10-CM | POA: Diagnosis not present

## 2017-11-14 DIAGNOSIS — F4310 Post-traumatic stress disorder, unspecified: Secondary | ICD-10-CM | POA: Diagnosis not present

## 2017-11-14 DIAGNOSIS — F411 Generalized anxiety disorder: Secondary | ICD-10-CM | POA: Diagnosis not present

## 2017-11-14 DIAGNOSIS — F331 Major depressive disorder, recurrent, moderate: Secondary | ICD-10-CM | POA: Diagnosis not present

## 2017-11-14 DIAGNOSIS — F909 Attention-deficit hyperactivity disorder, unspecified type: Secondary | ICD-10-CM | POA: Diagnosis not present

## 2017-12-03 DIAGNOSIS — I359 Nonrheumatic aortic valve disorder, unspecified: Secondary | ICD-10-CM | POA: Diagnosis not present

## 2018-01-02 DIAGNOSIS — E039 Hypothyroidism, unspecified: Secondary | ICD-10-CM | POA: Diagnosis not present

## 2018-01-02 DIAGNOSIS — Z7901 Long term (current) use of anticoagulants: Secondary | ICD-10-CM | POA: Diagnosis not present

## 2018-02-13 DIAGNOSIS — F4310 Post-traumatic stress disorder, unspecified: Secondary | ICD-10-CM | POA: Diagnosis not present

## 2018-02-13 DIAGNOSIS — F909 Attention-deficit hyperactivity disorder, unspecified type: Secondary | ICD-10-CM | POA: Diagnosis not present

## 2018-02-13 DIAGNOSIS — F411 Generalized anxiety disorder: Secondary | ICD-10-CM | POA: Diagnosis not present

## 2018-02-13 DIAGNOSIS — F331 Major depressive disorder, recurrent, moderate: Secondary | ICD-10-CM | POA: Diagnosis not present

## 2018-02-17 DIAGNOSIS — R300 Dysuria: Secondary | ICD-10-CM | POA: Diagnosis not present

## 2018-02-17 DIAGNOSIS — I359 Nonrheumatic aortic valve disorder, unspecified: Secondary | ICD-10-CM | POA: Diagnosis not present

## 2018-02-17 DIAGNOSIS — R599 Enlarged lymph nodes, unspecified: Secondary | ICD-10-CM | POA: Diagnosis not present

## 2018-02-17 DIAGNOSIS — L709 Acne, unspecified: Secondary | ICD-10-CM | POA: Diagnosis not present

## 2018-02-18 ENCOUNTER — Other Ambulatory Visit: Payer: Self-pay | Admitting: Family Medicine

## 2018-02-18 DIAGNOSIS — R599 Enlarged lymph nodes, unspecified: Secondary | ICD-10-CM

## 2018-02-27 ENCOUNTER — Other Ambulatory Visit: Payer: BLUE CROSS/BLUE SHIELD

## 2018-02-28 ENCOUNTER — Other Ambulatory Visit: Payer: BLUE CROSS/BLUE SHIELD

## 2018-03-07 ENCOUNTER — Ambulatory Visit
Admission: RE | Admit: 2018-03-07 | Discharge: 2018-03-07 | Disposition: A | Discharge status: Home / Self Care | Payer: BLUE CROSS/BLUE SHIELD | Source: Ambulatory Visit | Attending: Family Medicine | Admitting: Family Medicine

## 2018-03-07 DIAGNOSIS — R599 Enlarged lymph nodes, unspecified: Secondary | ICD-10-CM | POA: Diagnosis not present

## 2018-03-07 DIAGNOSIS — I359 Nonrheumatic aortic valve disorder, unspecified: Secondary | ICD-10-CM | POA: Diagnosis not present

## 2018-03-27 DIAGNOSIS — L709 Acne, unspecified: Secondary | ICD-10-CM | POA: Diagnosis not present

## 2018-03-27 DIAGNOSIS — R599 Enlarged lymph nodes, unspecified: Secondary | ICD-10-CM | POA: Diagnosis not present

## 2018-03-27 DIAGNOSIS — I359 Nonrheumatic aortic valve disorder, unspecified: Secondary | ICD-10-CM | POA: Diagnosis not present

## 2018-04-17 DIAGNOSIS — L700 Acne vulgaris: Secondary | ICD-10-CM | POA: Diagnosis not present

## 2018-04-17 DIAGNOSIS — L718 Other rosacea: Secondary | ICD-10-CM | POA: Diagnosis not present

## 2018-04-25 DIAGNOSIS — I359 Nonrheumatic aortic valve disorder, unspecified: Secondary | ICD-10-CM | POA: Diagnosis not present

## 2018-05-09 DIAGNOSIS — I359 Nonrheumatic aortic valve disorder, unspecified: Secondary | ICD-10-CM | POA: Diagnosis not present

## 2018-05-19 DIAGNOSIS — Z6833 Body mass index (BMI) 33.0-33.9, adult: Secondary | ICD-10-CM | POA: Diagnosis not present

## 2018-05-19 DIAGNOSIS — Z952 Presence of prosthetic heart valve: Secondary | ICD-10-CM | POA: Diagnosis not present

## 2018-05-19 DIAGNOSIS — Q231 Congenital insufficiency of aortic valve: Secondary | ICD-10-CM | POA: Diagnosis not present

## 2018-05-19 DIAGNOSIS — I359 Nonrheumatic aortic valve disorder, unspecified: Secondary | ICD-10-CM | POA: Diagnosis not present

## 2018-05-22 DIAGNOSIS — F331 Major depressive disorder, recurrent, moderate: Secondary | ICD-10-CM | POA: Diagnosis not present

## 2018-05-22 DIAGNOSIS — F411 Generalized anxiety disorder: Secondary | ICD-10-CM | POA: Diagnosis not present

## 2018-05-22 DIAGNOSIS — F909 Attention-deficit hyperactivity disorder, unspecified type: Secondary | ICD-10-CM | POA: Diagnosis not present

## 2018-05-22 DIAGNOSIS — F4310 Post-traumatic stress disorder, unspecified: Secondary | ICD-10-CM | POA: Diagnosis not present

## 2018-05-23 IMAGING — DX DG FINGER THUMB 2+V*L*
3 series · 3 of 3 positions shown · non-contrast
Comparison: LEFT hand radiographs 07/10/2008

CLINICAL DATA: Was trying a move a downed tree and caught LEFT
thumb on a nail, laceration, question foreign body

EXAM:
LEFT THUMB 2+V

[finger ap]
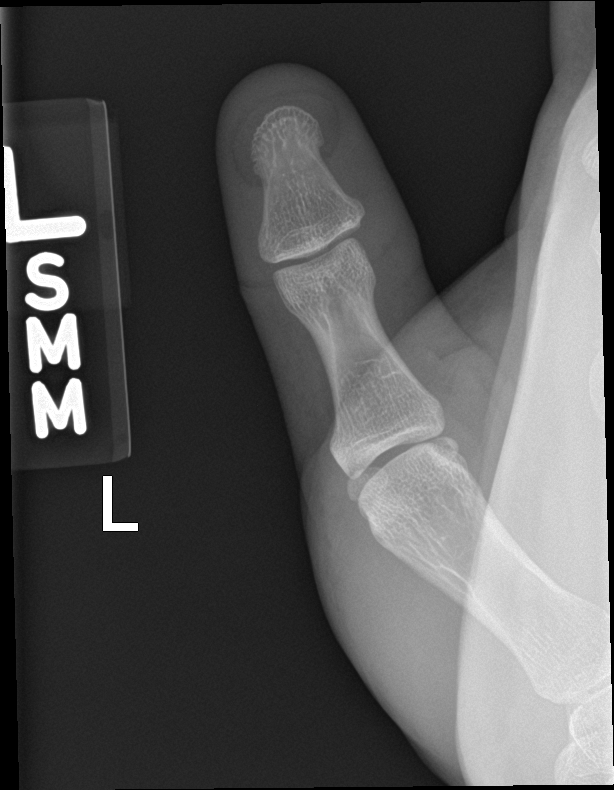

[finger obl]
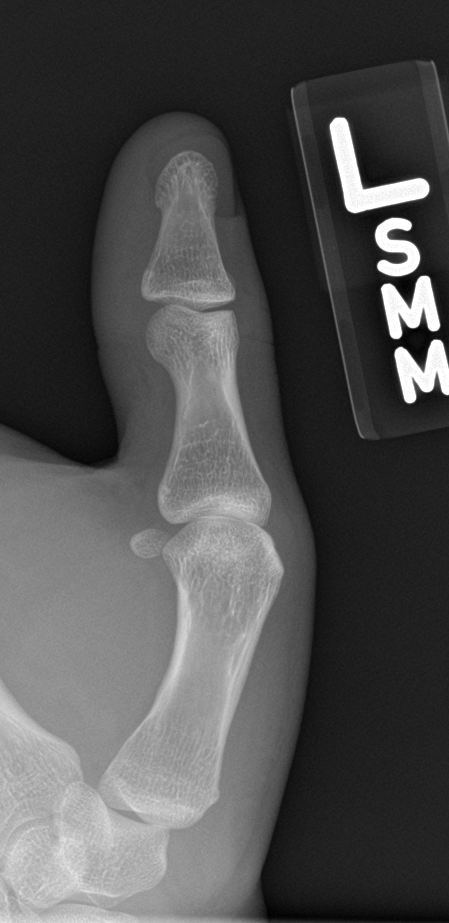

[finger lat]
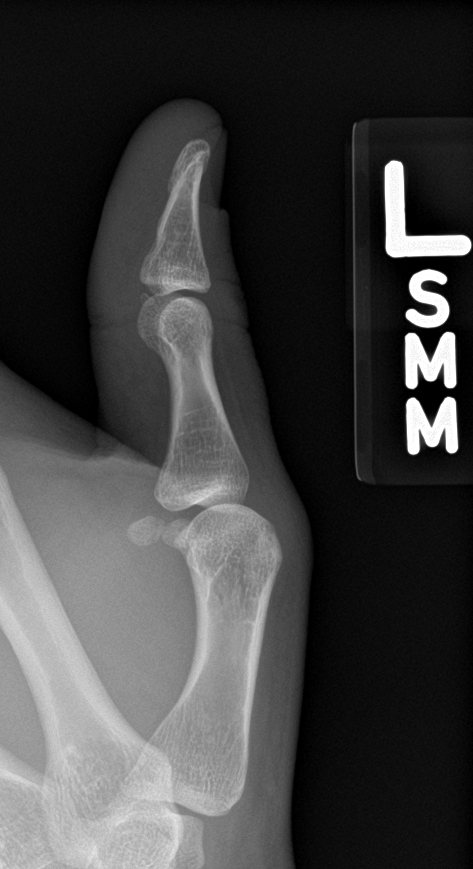

[3 of 3 positions shown; findings below may reference images not displayed]

FINDINGS: Osseous mineralization normal.

Joint spaces preserved.

No acute fracture, dislocation or bone destruction.

No radiopaque foreign body.
IMPRESSION: No acute abnormalities.

## 2018-05-30 DIAGNOSIS — I359 Nonrheumatic aortic valve disorder, unspecified: Secondary | ICD-10-CM | POA: Diagnosis not present

## 2018-06-26 DIAGNOSIS — I359 Nonrheumatic aortic valve disorder, unspecified: Secondary | ICD-10-CM | POA: Diagnosis not present

## 2018-07-10 DIAGNOSIS — I359 Nonrheumatic aortic valve disorder, unspecified: Secondary | ICD-10-CM | POA: Diagnosis not present

## 2018-08-04 DIAGNOSIS — I359 Nonrheumatic aortic valve disorder, unspecified: Secondary | ICD-10-CM | POA: Diagnosis not present

## 2018-08-19 DIAGNOSIS — F411 Generalized anxiety disorder: Secondary | ICD-10-CM | POA: Diagnosis not present

## 2018-08-19 DIAGNOSIS — F4310 Post-traumatic stress disorder, unspecified: Secondary | ICD-10-CM | POA: Diagnosis not present

## 2018-08-19 DIAGNOSIS — F331 Major depressive disorder, recurrent, moderate: Secondary | ICD-10-CM | POA: Diagnosis not present

## 2018-08-19 DIAGNOSIS — F909 Attention-deficit hyperactivity disorder, unspecified type: Secondary | ICD-10-CM | POA: Diagnosis not present

## 2018-09-19 DIAGNOSIS — I359 Nonrheumatic aortic valve disorder, unspecified: Secondary | ICD-10-CM | POA: Diagnosis not present

## 2018-10-29 DIAGNOSIS — I359 Nonrheumatic aortic valve disorder, unspecified: Secondary | ICD-10-CM | POA: Diagnosis not present

## 2018-11-10 DIAGNOSIS — L718 Other rosacea: Secondary | ICD-10-CM | POA: Diagnosis not present

## 2018-11-17 DIAGNOSIS — F331 Major depressive disorder, recurrent, moderate: Secondary | ICD-10-CM | POA: Diagnosis not present

## 2018-11-17 DIAGNOSIS — F4310 Post-traumatic stress disorder, unspecified: Secondary | ICD-10-CM | POA: Diagnosis not present

## 2018-11-17 DIAGNOSIS — F909 Attention-deficit hyperactivity disorder, unspecified type: Secondary | ICD-10-CM | POA: Diagnosis not present

## 2018-11-17 DIAGNOSIS — F411 Generalized anxiety disorder: Secondary | ICD-10-CM | POA: Diagnosis not present

## 2018-11-25 DIAGNOSIS — I359 Nonrheumatic aortic valve disorder, unspecified: Secondary | ICD-10-CM | POA: Diagnosis not present

## 2018-12-11 DIAGNOSIS — I359 Nonrheumatic aortic valve disorder, unspecified: Secondary | ICD-10-CM | POA: Diagnosis not present

## 2019-01-20 DIAGNOSIS — I359 Nonrheumatic aortic valve disorder, unspecified: Secondary | ICD-10-CM | POA: Diagnosis not present

## 2019-02-09 DIAGNOSIS — F331 Major depressive disorder, recurrent, moderate: Secondary | ICD-10-CM | POA: Diagnosis not present

## 2019-02-09 DIAGNOSIS — F411 Generalized anxiety disorder: Secondary | ICD-10-CM | POA: Diagnosis not present

## 2019-02-09 DIAGNOSIS — F909 Attention-deficit hyperactivity disorder, unspecified type: Secondary | ICD-10-CM | POA: Diagnosis not present

## 2019-02-09 DIAGNOSIS — F4310 Post-traumatic stress disorder, unspecified: Secondary | ICD-10-CM | POA: Diagnosis not present

## 2019-02-10 DIAGNOSIS — I359 Nonrheumatic aortic valve disorder, unspecified: Secondary | ICD-10-CM | POA: Diagnosis not present

## 2019-03-25 DIAGNOSIS — I359 Nonrheumatic aortic valve disorder, unspecified: Secondary | ICD-10-CM | POA: Diagnosis not present

## 2019-04-24 DIAGNOSIS — Z952 Presence of prosthetic heart valve: Secondary | ICD-10-CM | POA: Diagnosis not present

## 2019-04-24 DIAGNOSIS — Z7901 Long term (current) use of anticoagulants: Secondary | ICD-10-CM | POA: Diagnosis not present

## 2019-05-04 DIAGNOSIS — F4310 Post-traumatic stress disorder, unspecified: Secondary | ICD-10-CM | POA: Diagnosis not present

## 2019-05-04 DIAGNOSIS — F411 Generalized anxiety disorder: Secondary | ICD-10-CM | POA: Diagnosis not present

## 2019-05-04 DIAGNOSIS — F909 Attention-deficit hyperactivity disorder, unspecified type: Secondary | ICD-10-CM | POA: Diagnosis not present

## 2019-05-04 DIAGNOSIS — F331 Major depressive disorder, recurrent, moderate: Secondary | ICD-10-CM | POA: Diagnosis not present

## 2019-05-22 DIAGNOSIS — Z952 Presence of prosthetic heart valve: Secondary | ICD-10-CM | POA: Diagnosis not present

## 2019-05-22 DIAGNOSIS — Z7901 Long term (current) use of anticoagulants: Secondary | ICD-10-CM | POA: Diagnosis not present

## 2019-06-10 IMAGING — US US SOFT TISSUE HEAD/NECK
1 series · 14 of 21 positions shown · non-contrast
Comparison: None.

CLINICAL DATA: Palpable lump left supraclavicular region.

EXAM:
ULTRASOUND OF HEAD/NECK SOFT TISSUES
TECHNIQUE: Ultrasound examination of the head and neck soft tissues was
performed in the area of clinical concern.

[Series 1: us soft tissue head/neck · 0.06mm/px · 14 of 21 slices shown]
[im 1/21]
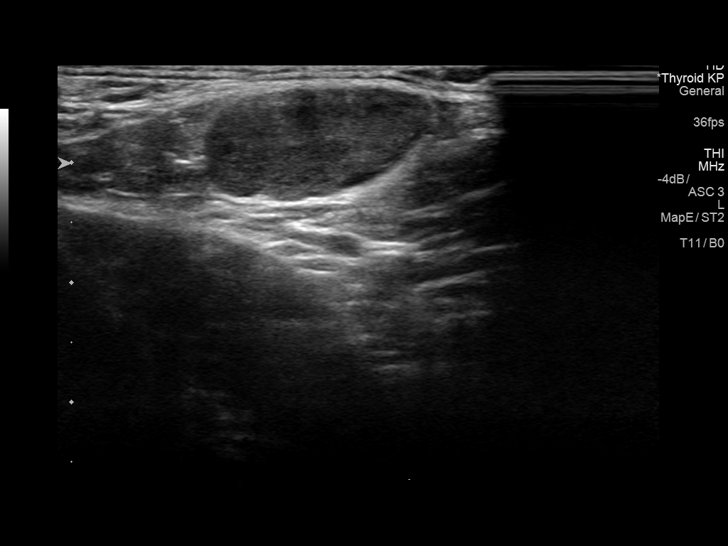
[im 3/21]
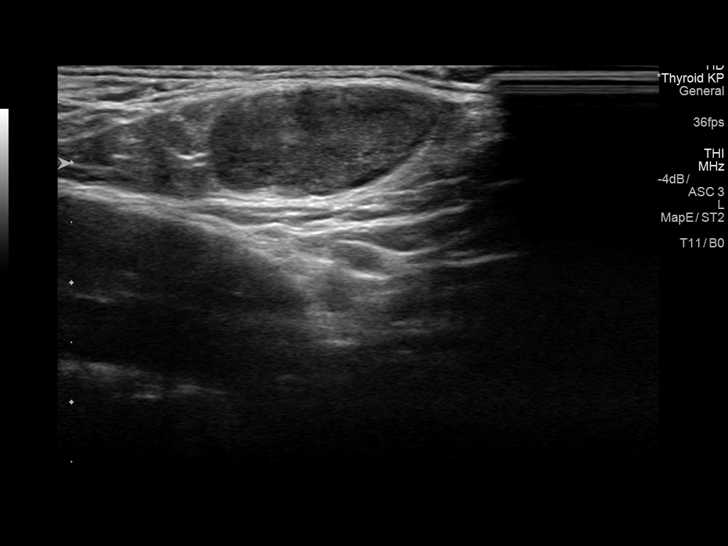
[im 4/21]
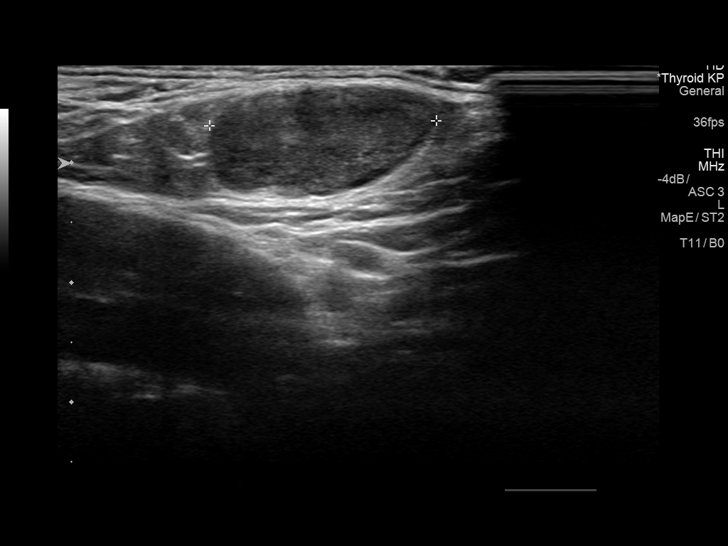
[im 6/21]
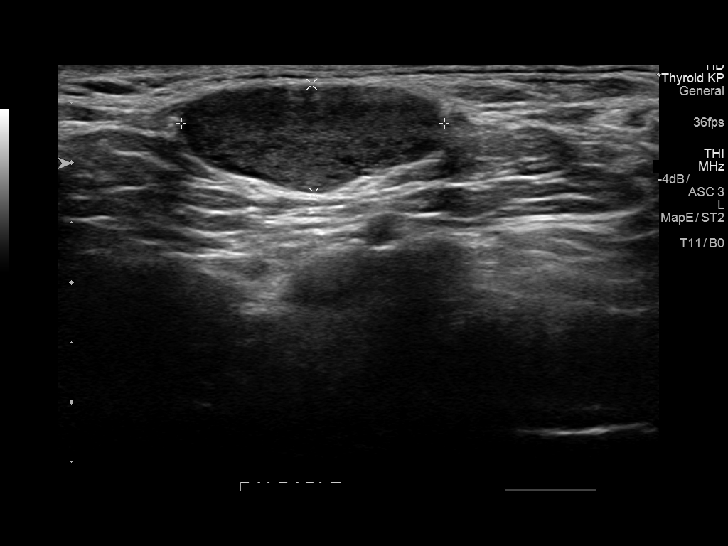
[im 7/21]
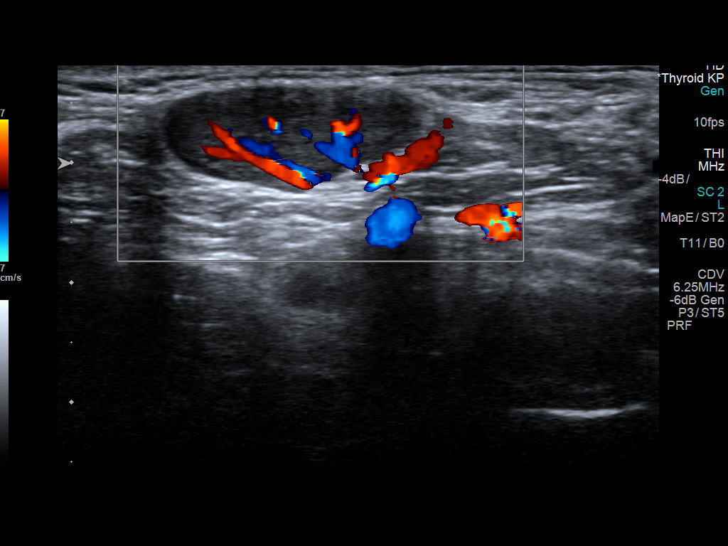
[im 9/21]
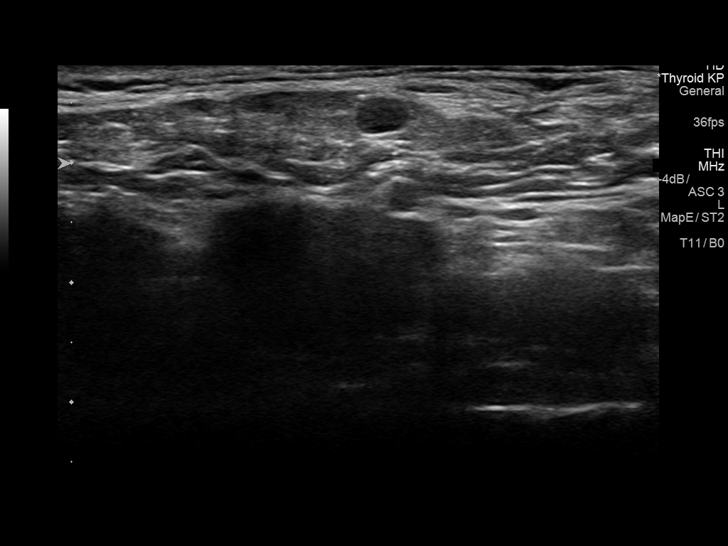
[im 10/21]
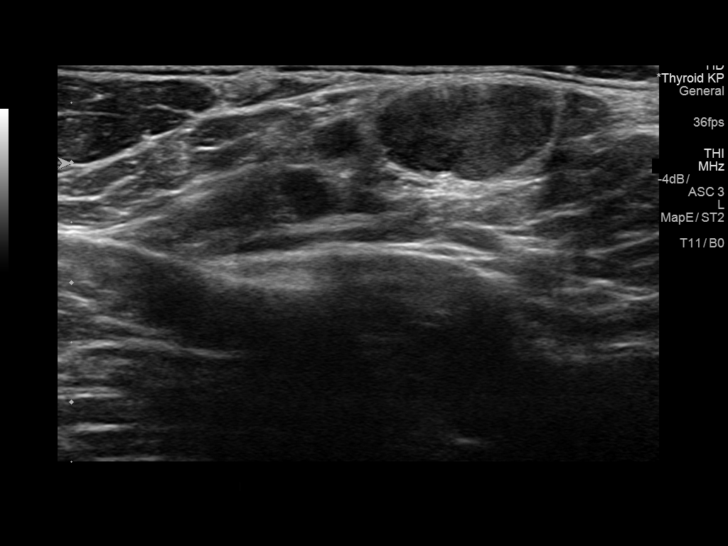
[im 12/21]
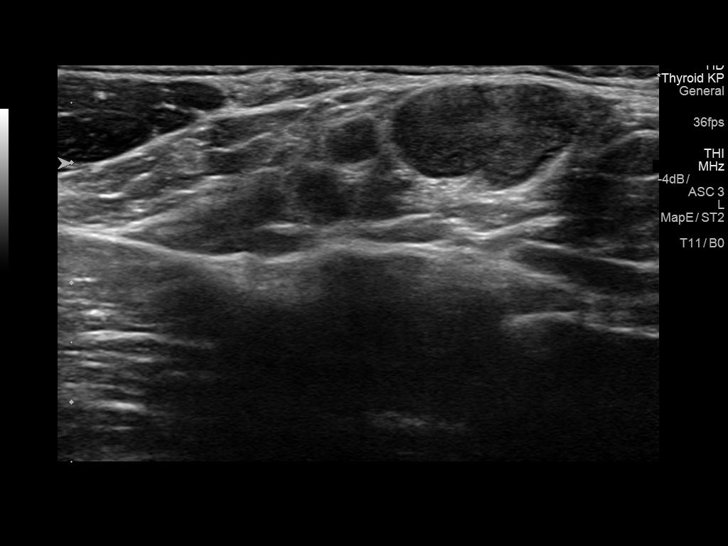
[im 13/21]
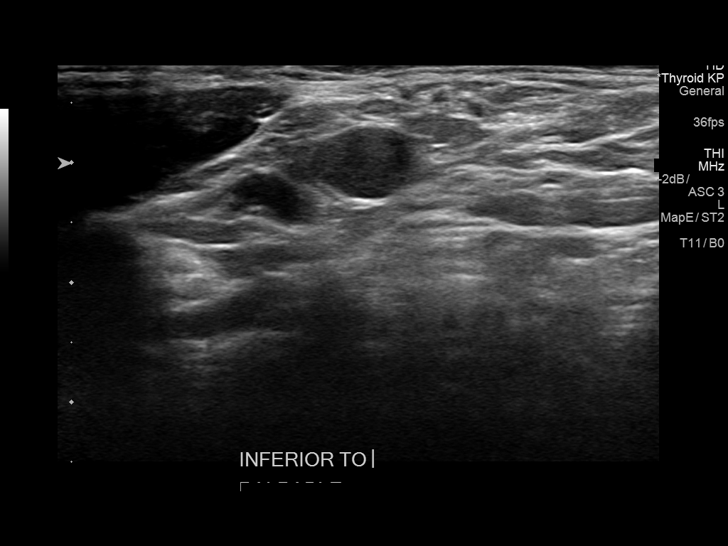
[im 15/21]
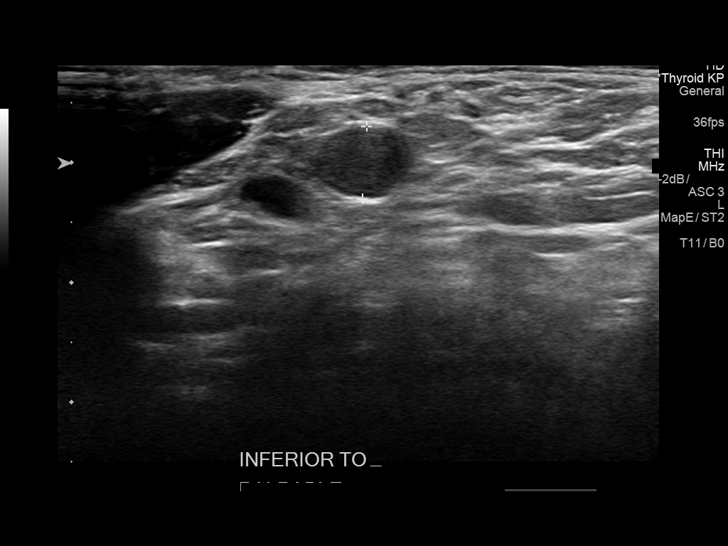
[im 16/21]
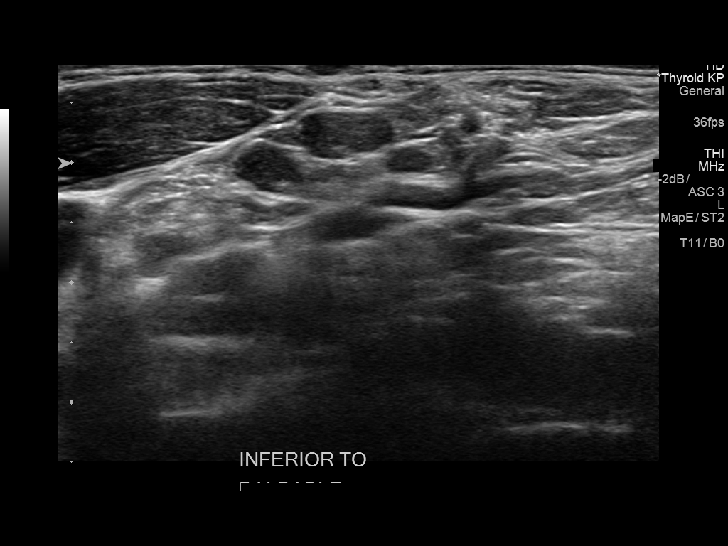
[im 18/21]
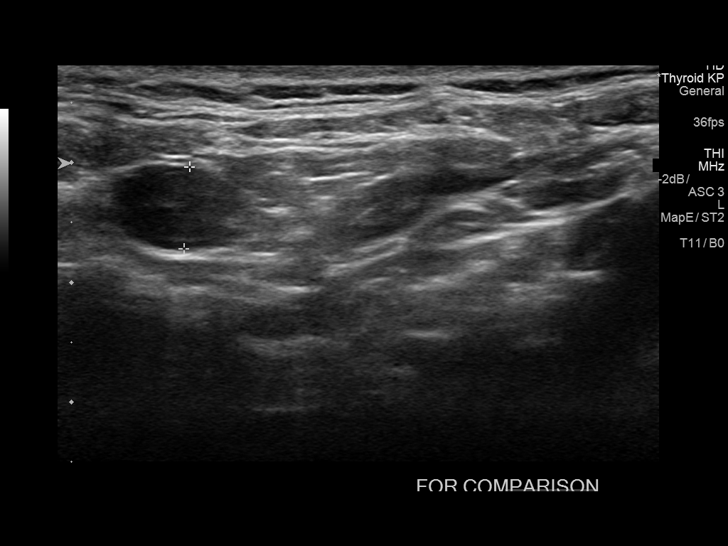
[im 19/21]
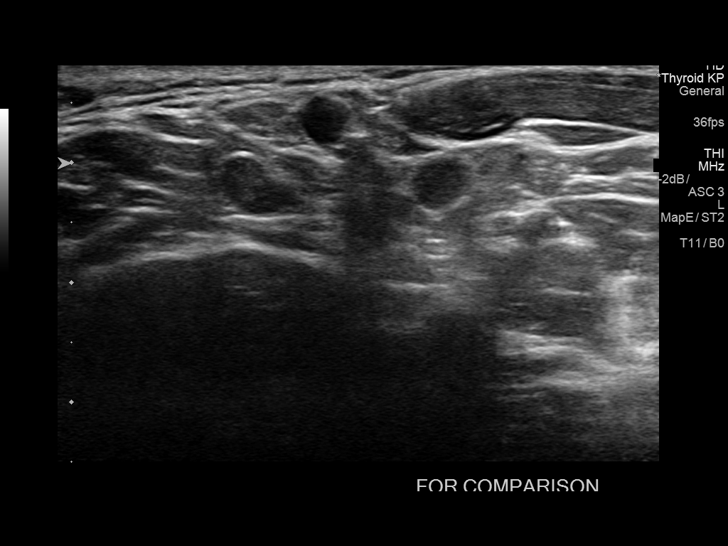
[im 21/21]
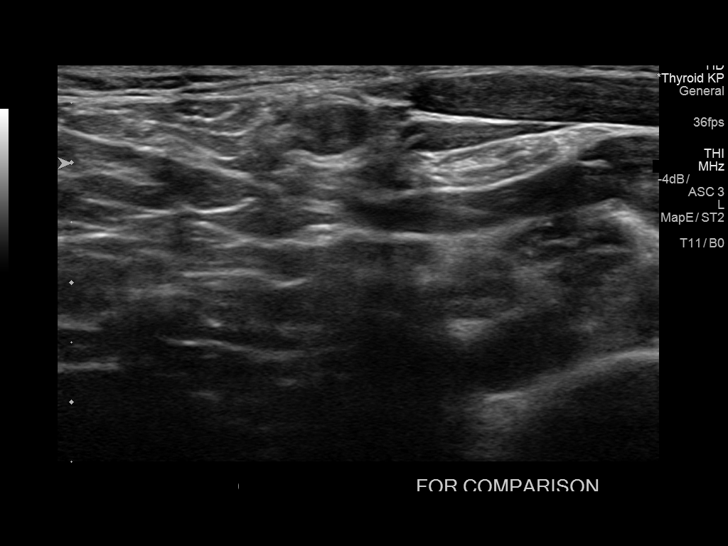

[14 of 21 positions shown; findings below may reference images not displayed]

FINDINGS: Abnormally large lymph node in the supraclavicular region on the
left measuring 2.2 x 0.9 x 1.9 cm. Smaller adjacent node measuring 5
mm in diameter. Small right supraclavicular nodes, none larger than
13 x 7 mm.
IMPRESSION: Supraclavicular lymphadenopathy on the left. Slightly prominent
supraclavicular node on the right. The appearance is nonspecific and
these nodes may be enlarged in a reactive fashion. However, lymphoma
or Hodgkin's disease is not excluded on the basis of this study.

## 2019-06-19 DIAGNOSIS — Z952 Presence of prosthetic heart valve: Secondary | ICD-10-CM | POA: Diagnosis not present

## 2019-06-19 DIAGNOSIS — Z7901 Long term (current) use of anticoagulants: Secondary | ICD-10-CM | POA: Diagnosis not present

## 2019-07-03 DIAGNOSIS — Z03818 Encounter for observation for suspected exposure to other biological agents ruled out: Secondary | ICD-10-CM | POA: Diagnosis not present

## 2019-07-03 DIAGNOSIS — Z20828 Contact with and (suspected) exposure to other viral communicable diseases: Secondary | ICD-10-CM | POA: Diagnosis not present

## 2019-07-07 ENCOUNTER — Ambulatory Visit: Payer: BLUE CROSS/BLUE SHIELD | Admitting: Dermatology

## 2019-07-17 DIAGNOSIS — Z7901 Long term (current) use of anticoagulants: Secondary | ICD-10-CM | POA: Diagnosis not present

## 2019-07-17 DIAGNOSIS — Z952 Presence of prosthetic heart valve: Secondary | ICD-10-CM | POA: Diagnosis not present

## 2019-07-21 ENCOUNTER — Ambulatory Visit: Payer: BC Managed Care – PPO | Admitting: Dermatology

## 2019-07-21 ENCOUNTER — Encounter: Payer: Self-pay | Admitting: Dermatology

## 2019-07-21 ENCOUNTER — Other Ambulatory Visit: Payer: Self-pay

## 2019-07-21 DIAGNOSIS — L700 Acne vulgaris: Secondary | ICD-10-CM | POA: Diagnosis not present

## 2019-07-21 DIAGNOSIS — Z7901 Long term (current) use of anticoagulants: Secondary | ICD-10-CM | POA: Diagnosis not present

## 2019-07-21 NOTE — Patient Instructions (Signed)
Isotretinoin capsules What is this medicine? ISOTRETINOIN (eye soe TRET i noyn) treats severe acne that has not responded to other therapy like antibiotics. This medicine may be used for other purposes; ask your health care provider or pharmacist if you have questions. COMMON BRAND NAME(S): Absorica, Absorica LD, Accutane, Amnesteem, Claravis, MYORISAN, Sotret, ZENATANE What should I tell my health care provider before I take this medicine? They need to know if you have any of these conditions:  heart disease  high blood cholesterol or triglycerides  inflammatory bowel disease  liver disease  mental problems, such as depression, psychosis, attempted suicide, or a family history of mental problems  osteoporosis, osteomalacia, or other bone disorders  pancreatitis  an unusual or allergic reaction to isotretinoin, vitamin A or related drugs, parabens, other medicines, foods, dyes, or preservatives  pregnant or trying to get pregnant  breast-feeding How should I use this medicine? Take this medicine by mouth with a full glass of water. Follow the directions on the prescription label. Do not chew or suck on the capsules. Take your medicine at regular intervals. Do not take your medicine more often than directed. Do not stop taking except on your doctor's advice. A special MedGuide will be given to you by the pharmacist with each prescription and refill. Be sure to read this information carefully each time. Talk to your pediatrician regarding the use of this medicine in children. Special care may be needed. Overdosage: If you think you have taken too much of this medicine contact a poison control center or emergency room at once. NOTE: This medicine is only for you. Do not share this medicine with others. What if I miss a dose? If you miss a dose, skip that dose. Do not take double or extra doses. If you take more than your prescribed dose, call your doctor or poison control center right  away. What may interact with this medicine? Do not take this medicine with any of the following medications:  vitamins and other supplements containing vitamin A This medicine may also interact with the following medications:  alcohol  benzoyl peroxide, salicylic acid, or other drying medicines used for acne  medicines for seizures  orlistat  other drugs that make you more sensitive to the sun such as sulfa drugs  progestin-only birth control hormones  st. john's wort  steroid medicines like prednisone or cortisone  tetracycline antibiotics like doxycycline and tetracycline  warfarin This list may not describe all possible interactions. Give your health care provider a list of all the medicines, herbs, non-prescription drugs, or dietary supplements you use. Also tell them if you smoke, drink alcohol, or use illegal drugs. Some items may interact with your medicine. What should I watch for while using this medicine? You may experience a flare in your acne during the initial treatment period. You will need to see your doctor or health care professional monthly to get a new prescription and to check on your progress and for side effects. To receive this medicine, you, your doctor and your pharmacy must be registered in the iPLEDGE program. You may only receive up to a 30 day supply of this medicine at one time. You will need a new prescription for each refill. Your prescription must be filled within 7 days of your doctor's office visit. This medicine can cause birth defects. Do not get pregnant while taking this drug. Females will need to have 2 negative pregnancy tests before starting this medicine and then monthly pregnancy tests during treatment,   even if you are not sexually active. Use 2 reliable forms of birth control together for 1 month prior to, during, and for 1 month after stopping this medicine. Avoid using birth control pills that do not contain estrogen. They may not work  while you are taking this medicine. If you become pregnant, miss a menstrual cycle, or stop using birth control, you must immediately stop taking this medicine. If you are pregnant, report it to FDA MedWatch at 1-800-FDA-1088 and the iPLEDGE pregnancy registry at 1-866-495-0654. Severe birth defects may occur even if just one dose is taken. Do not breast-feed while taking this medicine or for 1 month after stopping treatment. Do not give blood while taking this medicine and for 30 days after completion of treatment to avoid exposing pregnant women to this medicine through the donated blood. Some patients have become depressed or developed serious mental problems while taking this medicine or soon after stopping. Stop taking this medicine if you start feeling depressed or have thoughts of violence or suicide. Contact your doctor. This medicine can increase cholesterol and triglyceride levels and decrease HDL (the good cholesterol) levels. Your health care provider will monitor these levels and recommend appropriate therapy, including changes in diet or prescription drugs, if necessary. Alcohol can increase the risk of developing high cholesterol or high blood lipids. Avoid alcoholic drinks while you are taking this medicine. If you wear contact lenses, they may feel uncomfortable. If your eyes get dry, check with your eye doctor. This medicine may decrease your night vision or cause other changes in vision. If you experience any change in vision, stop taking this medicine and see an eye doctor. This medicine can make you more sensitive to the sun. Keep out of the sun. If you cannot avoid being in the sun, wear protective clothing and use sunscreen. Do not use sun lamps or tanning beds/booths. Cosmetic procedures to smooth your skin including waxing, dermabrasion, or laser therapy should be avoided during therapy and for at least 6 months after you stop because of the possibility of scarring. Check with your  health care provider for advice about when you can have cosmetic procedures. This medicine may affect your blood sugar levels. If you have diabetes check with your doctor or health care professional if you notice any change in your blood sugar tests. What side effects may I notice from receiving this medicine? Side effects that you should report to your doctor or health care professional as soon as possible:  allergic reactions like skin rash, itching or hives, swelling of the face, lips, or tongue  breathing problems  changes in menstrual cycle  changes in vision, like blurred or double vision or decreased night vision  chest pain  depression  dizziness  fainting  hearing loss or ringing in the ears  hives, skin rash  increased irritability, anger, aggression or thoughts of violence  increased urination and/or thirst or dark urine  irregular heartbeat  loss of interest in usual activities  muscle or joint pain  muscle weakness with or without pain  nausea and vomiting  severe diarrhea  severe headache  severe stomach pain  slurred speech or trouble swallowing  start to have thoughts about hurting yourself  swelling of face or mouth  unusual bruising or bleeding  yellowing of the eyes or skin Side effects that usually do not require medical attention (report to your doctor or health care professional if they continue or are bothersome):  chapped lips  dry mouth, nose   or skin  flushing  hair loss, increased fragility of hair  headache (mild) This list may not describe all possible side effects. Call your doctor for medical advice about side effects. You may report side effects to FDA at 1-800-FDA-1088. Where should I keep my medicine? Keep out of the reach of children. Store at room temperature between 15 and 30 degrees C (59 and 86 degrees F). Throw away any unused medicine after the expiration date. NOTE: This sheet is a summary. It may not cover  all possible information. If you have questions about this medicine, talk to your doctor, pharmacist, or health care provider.  2020 Elsevier/Gold Standard (2007-06-24 16:57:13)  

## 2019-07-21 NOTE — Progress Notes (Signed)
   New Patient   Subjective  Adam Bray is a 24 y.o. male who presents for the following: Acne (chest,face, back and scalp x months tx doxy 100mg  bid, differin gel ( no help) then tried [facility] 100mg  bid & differin - (tx at [facility] derm)).  Acne Location: Waist up to scalp Duration: Years Quality: Severe Associated Signs/Symptoms: Modifying Factors: Multiple oral antibiotics most topicals tried at [facility] dermatology without success. Severity:  Timing: Context: History of chronic Coumadin therapy for heart valve.  Cardiologist is Dr. .   The following portions of the chart were reviewed this encounter and updated as appropriate: Tobacco  Allergies  Meds  Problems  Med Hx  Surg Hx  Fam Hx      Objective  Well appearing patient in no apparent distress; mood and affect are within normal limits.  All skin waist up examined. Extensive severe acne with pustules and cysts extending from lower torso to scalp.  Assessment & Plan  Acne vulgaris (11) Left Breast; Right Breast; Left Upper Back; Right Upper Back; Left Lower Back (2); Right Lower Back; Mid Frontal Scalp; Left Buccal Cheek ; Right Buccal Cheek ; Scalp  [name] has already failed several oral antibiotics and his chronic Coumadin therapy is a relative contraindication to repeated antibiotics.  Will check with his cardiologist and I will double check the safety of isotretinoin in someone on Coumadin therapy.  Follow-up via MyChart or by phone next week.

## 2019-07-26 ENCOUNTER — Encounter: Payer: Self-pay | Admitting: Dermatology

## 2019-09-04 ENCOUNTER — Emergency Department (HOSPITAL_COMMUNITY): Payer: BC Managed Care – PPO

## 2019-09-04 ENCOUNTER — Encounter (HOSPITAL_COMMUNITY): Payer: Self-pay | Admitting: Emergency Medicine

## 2019-09-04 ENCOUNTER — Observation Stay (HOSPITAL_COMMUNITY)
Admission: EM | Admit: 2019-09-04 | Discharge: 2019-09-05 | Disposition: A | Discharge status: Home / Self Care | Payer: BC Managed Care – PPO | Attending: Internal Medicine | Admitting: Internal Medicine

## 2019-09-04 ENCOUNTER — Other Ambulatory Visit: Payer: Self-pay

## 2019-09-04 DIAGNOSIS — I639 Cerebral infarction, unspecified: Secondary | ICD-10-CM | POA: Diagnosis present

## 2019-09-04 DIAGNOSIS — Z8616 Personal history of COVID-19: Secondary | ICD-10-CM | POA: Diagnosis not present

## 2019-09-04 DIAGNOSIS — H53131 Sudden visual loss, right eye: Principal | ICD-10-CM | POA: Insufficient documentation

## 2019-09-04 DIAGNOSIS — Z20822 Contact with and (suspected) exposure to covid-19: Secondary | ICD-10-CM | POA: Diagnosis not present

## 2019-09-04 DIAGNOSIS — R791 Abnormal coagulation profile: Secondary | ICD-10-CM | POA: Diagnosis present

## 2019-09-04 DIAGNOSIS — Z79899 Other long term (current) drug therapy: Secondary | ICD-10-CM | POA: Diagnosis not present

## 2019-09-04 DIAGNOSIS — F331 Major depressive disorder, recurrent, moderate: Secondary | ICD-10-CM | POA: Diagnosis present

## 2019-09-04 DIAGNOSIS — Z87891 Personal history of nicotine dependence: Secondary | ICD-10-CM | POA: Insufficient documentation

## 2019-09-04 DIAGNOSIS — F909 Attention-deficit hyperactivity disorder, unspecified type: Secondary | ICD-10-CM | POA: Diagnosis present

## 2019-09-04 DIAGNOSIS — G453 Amaurosis fugax: Secondary | ICD-10-CM | POA: Diagnosis present

## 2019-09-04 LAB — COMPREHENSIVE METABOLIC PANEL
ALT: 43 U/L (ref 0–44)
AST: 35 U/L (ref 15–41)
Albumin: 4.6 g/dL (ref 3.5–5.0)
Alkaline Phosphatase: 88 U/L (ref 38–126)
Anion gap: 8 (ref 5–15)
BUN: 8 mg/dL (ref 6–20)
CO2: 25 mmol/L (ref 22–32)
Calcium: 9.1 mg/dL (ref 8.9–10.3)
Chloride: 103 mmol/L (ref 98–111)
Creatinine, Ser: 1.04 mg/dL (ref 0.61–1.24)
GFR calc Af Amer: >60 mL/min (ref >60)
GFR calc non Af Amer: >60 mL/min (ref >60)
Glucose, Bld: 86 mg/dL (ref 70–99)
Potassium: 3.7 mmol/L (ref 3.5–5.1)
Sodium: 136 mmol/L (ref 135–145)
Total Bilirubin: 0.8 mg/dL (ref 0.3–1.2)
Total Protein: 7.3 g/dL (ref 6.5–8.1)

## 2019-09-04 LAB — CBC
HCT: 43.4 % (ref 39.0–52.0)
Hemoglobin: 15 g/dL (ref 13.0–17.0)
MCH: 30.9 pg (ref 26.0–34.0)
MCHC: 34.6 g/dL (ref 30.0–36.0)
MCV: 89.5 fL (ref 80.0–100.0)
Platelets: 289 10*3/uL (ref 150–400)
RBC: 4.85 MIL/uL (ref 4.22–5.81)
RDW: 12 % (ref 11.5–15.5)
WBC: 10 10*3/uL (ref 4.0–10.5)
nRBC: 0 % (ref 0.0–0.2)

## 2019-09-04 LAB — HIV ANTIBODY (ROUTINE TESTING W REFLEX): HIV Screen 4th Generation wRfx: NONREACTIVE

## 2019-09-04 LAB — APTT: aPTT: 45 seconds — ABNORMAL HIGH (ref 24–36)

## 2019-09-04 LAB — SARS CORONAVIRUS 2 BY RT PCR (HOSPITAL ORDER, PERFORMED IN ~~LOC~~ HOSPITAL LAB): SARS Coronavirus 2: POSITIVE — AB

## 2019-09-04 LAB — PROTIME-INR
INR: 2.3 — ABNORMAL HIGH (ref 0.8–1.2)
Prothrombin Time: 24.4 seconds — ABNORMAL HIGH (ref 11.4–15.2)

## 2019-09-04 LAB — CBG MONITORING, ED: Glucose-Capillary: 78 mg/dL (ref 70–99)

## 2019-09-04 MED ORDER — IOHEXOL 350 MG/ML SOLN
75.0000 mL | Freq: Once | INTRAVENOUS | Status: AC | PRN
  Administered 2019-09-04: 75 mL via INTRAVENOUS

## 2019-09-04 MED ORDER — SODIUM CHLORIDE 0.9% FLUSH
3.0000 mL | Freq: Once | INTRAVENOUS | Status: AC
Start: 2019-09-04 — End: 2019-09-04
  Administered 2019-09-04: 3 mL via INTRAVENOUS

## 2019-09-04 MED ORDER — ACETAMINOPHEN 160 MG/5ML PO SOLN: 650.0000 mg | ORAL | Status: DC | PRN

## 2019-09-04 MED ORDER — ACETAMINOPHEN 650 MG RE SUPP: 650.0000 mg | RECTAL | Status: DC | PRN

## 2019-09-04 MED ORDER — ACETAMINOPHEN 325 MG PO TABS: 650.0000 mg | ORAL_TABLET | ORAL | Status: DC | PRN

## 2019-09-04 MED ORDER — ENOXAPARIN SODIUM 40 MG/0.4ML ~~LOC~~ SOLN: 40.0000 mg | SUBCUTANEOUS | Status: DC

## 2019-09-04 MED ORDER — WARFARIN SODIUM 4 MG PO TABS
4.0000 mg | ORAL_TABLET | Freq: Once | ORAL | Status: AC
  Administered 2019-09-04: 4 mg via ORAL
  Filled 2019-09-04: qty 1

## 2019-09-04 MED ORDER — WARFARIN - PHARMACIST DOSING INPATIENT: Freq: Every day | Status: DC

## 2019-09-04 MED ORDER — BUSPIRONE HCL 10 MG PO TABS
10.0000 mg | ORAL_TABLET | Freq: Every day | ORAL | Status: DC
  Administered 2019-09-04: 10 mg via ORAL
  Filled 2019-09-04: qty 1

## 2019-09-04 MED ORDER — STROKE: EARLY STAGES OF RECOVERY BOOK
Freq: Once | Status: AC
  Filled 2019-09-04: qty 1

## 2019-09-04 MED ORDER — MIRTAZAPINE 15 MG PO TABS
45.0000 mg | ORAL_TABLET | Freq: Every day | ORAL | Status: DC
  Administered 2019-09-04: 45 mg via ORAL
  Filled 2019-09-04: qty 1
  Filled 2019-09-04: qty 3

## 2019-09-04 NOTE — Progress Notes (Signed)
ANTICOAGULATION CONSULT NOTE - Initial Consult  Pharmacy Consult for Warfarin Indication: mechanical aortic valve replacement  No Known Allergies  Patient Measurements: Height: 5\' 10"  (177.8 cm) Weight: 106.6 kg (235 lb) IBW/kg (Calculated) : 73  Vital Signs: Temp: 98.2 F (36.8 C) (07/16 1202) Temp Source: Oral (07/16 1202) BP: 132/81 (07/16 2000) Pulse Rate: 84 (07/16 2000)  Labs: Recent Labs    09/04/19 1212  HGB 15.0  HCT 43.4  PLT 289  APTT 45*  LABPROT 24.4*  INR 2.3*  CREATININE 1.04    Estimated Creatinine Clearance: 136.2 mL/min (by C-G formula based on SCr of 1.04 mg/dL).   Medical History: Past Medical History:  Diagnosis Date  . ADHD (attention deficit hyperactivity disorder)   . 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 Anxiety   . Aortic stenosis   . Constipation   . 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 Heart murmur   . Substance induced mood disorder (HCC) 10/04/2014    Medications:  Scheduled:  .  stroke: mapping our early stages of recovery book   Does not apply Once  . enoxaparin (LOVENOX) injection  40 mg Subcutaneous Q24H    Assessment: Patient is a 24yo male who presents with vision changes with history of mechanical aortic valve replacement. H/H & plt wnl. INR 2.3, which is subtherapeutic.  PTA regimen: warfarin 4mg  daily Monday-Friday & 3mg  daily on Saturday & Sunday  Goal of Therapy:  INR Goal 2.5-3 Monitor platelets by anticoagulation protocol: Yes   Plan:  INR subtherapeutic Give 4mg  warfarin tonight Check daily INR & CBC  , PharmD Candidate 09/04/2019,8:17 PM

## 2019-09-04 NOTE — ED Provider Notes (Signed)
= [facility] EMERGENCY DEPARTMENT Provider Note   CSN: [id number] Arrival date & time: 09/04/19  1154     History Chief Complaint  Patient presents with  . Loss of Vision    Adam Bray is a 24 y.o. male.  The history is provided by the patient and medical records. No language interpreter was used.     24 year old male with history of aortic stenosis, status post warfarin for mechanical aortic valve, alcohol abuse, depression, presenting to ED for evaluation of vision changes.  Patient report he was at work today and approximately 4 hours ago he experienced a transient right eye vision loss.  States that he lost vision completely in his right eye lasting for approximately 2 minutes follows with slow resolutions of the symptoms where he begins to gain vision in the corner of his eye.  After 5 minutes his vision loss resolved.  He did endorse some mild sharp frontal headache that is lingering and is improving without any specific treatment.  Does not complain of any fever or chills no neck pain no confusion no arm weakness numbness no chest pain or trouble breathing or heart palpitation.  He denies any injury.  He has been compliant with his warfarin.   Past Medical History:  Diagnosis Date  . ADHD (attention deficit hyperactivity disorder)   . 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 Anxiety   . Aortic stenosis   . Constipation   . 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 Heart murmur   . Substance induced mood disorder (HCC) 10/04/2014    Patient Active Problem List   Diagnosis Date Noted  . 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 Substance induced mood disorder (HCC) 10/04/2014  . MDD (major depressive disorder), recurrent episode, moderate (HCC) 10/04/2014    Past Surgical History:    Procedure Laterality Date  . CARDIAC SURGERY    . NASAL SEPTUM SURGERY         Family History  Problem Relation Age of Onset  . Hirschsprung's disease Neg Hx     Social History   Tobacco Use  . Smoking status: Former Smoker    Packs/day: 0.25    Types: Cigarettes    Quit date: 11/19/2017    Years since quitting: 1.7  . Smokeless tobacco: Never Used  Substance Use Topics  . Alcohol use: Not Currently    Comment: occasionally  . Drug use: Never    Home Medications Prior to Admission medications   Medication Sig Start Date End Date Taking? Authorizing Provider  busPIRone (BUSPAR) 10 MG tablet Take 10 mg by mouth 3 (three) times daily.    [provider]  GuanFACINE HCl (INTUNIV) 3 MG TB24 Take 3 mg by mouth at bedtime.    [provider]  mirtazapine (REMERON) 45 MG tablet Take 45 mg by mouth at bedtime.    [provider]  warfarin (COUMADIN) 3 MG tablet Take 3 mg by mouth See admin instructions. On Sunday, Tuesday, Thursday and Saturday    [provider]  warfarin (COUMADIN) 4 MG tablet Take 4 mg by mouth every Monday, Wednesday, and Friday.    [provider]    Allergies    Patient has no known allergies.  Review of Systems   Review of Systems  All other systems reviewed and are negative.   Physical Exam Updated  Vital Signs BP 136/89 (BP Location: Left Arm)   Pulse 95   Temp 98.2 F (36.8 C) (Oral)   Resp 16   Ht 5\' 10"  (1.778 m)   Wt 106.6 kg   SpO2 100%   BMI 33.72 kg/m   Physical Exam Vitals and nursing note reviewed.  Constitutional:      General: He is not in acute distress.    Appearance: He is well-developed.  HENT:     Head: Atraumatic.     Mouth/Throat:     Mouth: Mucous membranes are moist.  Eyes:     Extraocular Movements: Extraocular movements intact.     Conjunctiva/sclera: Conjunctivae normal.     Pupils: Pupils are equal, round, and reactive to light.  Cardiovascular:     Rate and  Rhythm: Normal rate and regular rhythm.     Pulses: Normal pulses.     Heart sounds: Murmur heard.   Pulmonary:     Effort: Pulmonary effort is normal.     Breath sounds: Normal breath sounds.  Abdominal:     Tenderness: There is no abdominal tenderness.  Musculoskeletal:     Cervical back: Neck supple.  Skin:    Findings: No rash.  Neurological:     Mental Status: He is alert and oriented to person, place, and time.     GCS: GCS eye subscore is 4. GCS verbal subscore is 5. GCS motor subscore is 6.     Cranial Nerves: Cranial nerves are intact.     Sensory: Sensation is intact.     Motor: Motor function is intact.     Coordination: Coordination is intact.     Gait: Gait is intact.     ED Results / Procedures / Treatments   Labs (all labs ordered are listed, but only abnormal results are displayed) Labs Reviewed  PROTIME-INR - Abnormal; Notable for the following components:      Result Value   Prothrombin Time 24.4 (*)    INR 2.3 (*)    All other components within normal limits  APTT - Abnormal; Notable for the following components:   aPTT 45 (*)    All other components within normal limits  CBC  COMPREHENSIVE METABOLIC PANEL  CBG MONITORING, ED    EKG None  Radiology CT HEAD WO CONTRAST  Result Date: 09/04/2019 CLINICAL DATA:  Right eye blindness. EXAM: CT HEAD WITHOUT CONTRAST TECHNIQUE: Contiguous axial images were obtained from the base of the skull through the vertex without intravenous contrast. COMPARISON:  May 20, 2009. FINDINGS: Brain: No evidence of acute infarction, hemorrhage, hydrocephalus, extra-axial collection or mass lesion/mass effect. Vascular: No hyperdense vessel or unexpected calcification. Skull: Normal. Negative for fracture or focal lesion. Sinuses/Orbits: No acute finding. Other: None. IMPRESSION: Normal head CT. Electronically Signed   By: May 22, 2009 M.D.   On: 09/04/2019 13:54    Procedures Procedures (including critical care  time)  Medications Ordered in ED Medications  sodium chloride flush (NS) 0.9 % injection 3 mL (3 mLs Intravenous Given 09/04/19 1403)    ED Course  I have reviewed the triage vital signs and the nursing notes.  Pertinent labs & imaging results that were available during my care of the patient were reviewed by me and considered in my medical decision making (see chart for details).    MDM Rules/Calculators/A&P                          BP  127/78   Pulse 85   Temp 98.2 F (36.8 C) (Oral)   Resp 20   Ht 5\' 10"  (1.778 m)   Wt 106.6 kg   SpO2 98%   BMI 33.72 kg/m  Final Clinical Impression(s) / ED Diagnoses Final diagnoses:  Amaurosis fugax of right eye    Rx / DC Orders ED Discharge Orders    None     Patient presents with transient right monocular vision loss for approximately 5 minutes suggestive of amauroxis fugax.  He has a heart valve and is currently on Coumadin.  His INR is 2.3.  He endorsed a mild headache, head CT scan unremarkable.  His heart valve is MRI conditional.  1:52 PM From consultation was made to on-call neurologist, Dr. Development worker, international aid who recommend brain MRI along with head and neck CT angiogram for further evaluation.  If negative, patient should be evaluated by ophthalmologist for potential central retinal artery occlusion.  Work-up initiated.  Care discussed with Dr. Wilford Corner. Doubt retinal detachment.   Pt sign out to oncoming team who will f/u on CT and MRI result and determine disposition.         Clarice Pole, PA-C 09/04/19 1518    09/06/19, MD 09/06/19 [phone]

## 2019-09-04 NOTE — H&P (Signed)
History and Physical    Adam Bray EXH:[id number] DOB: 10-May-1995 DOA: 09/04/2019  PCP: Lars Mage, PA-C  Patient coming from: Home, wife at bedside I have personally briefly reviewed patient's old medical records in [facility]  Chief Complaint: Transient right vision loss  HPI: Adam Bray is a 24 y.o. male with medical history significant for bicuspid aortic valve with aortic valve disease s/p mechanical valve replacement in 2015, anxiety, ADHD and history of alcohol abuse who presents with transient right eye vision loss.  Patient reports that he was working today at his [employer] when he suddenly developed transient right vision loss.  States it started peripherally with loss centrally.  It went away in about a minute and improved first centrally and then peripherally.  He was only wearing prescription safety glasses at that time with no contacts.  States he has farsighted vision of his right eye and had a dilated eye exam about 3 months ago that was normal.  Has never had this before.  Patient thinks that perhaps he could be due to anxiety since in the past he has had some bilateral "fuzziness" around his eyes before from being anxious.  Patient denies tobacco, alcohol illicit drug use.   ED Course: He was afebrile, normotensive on room air. CBC and CMP unremarkable.  INR was subtherapeutic at 2.3 with his goal of 2.5-3.  CTA head and neck showed no hemodynamically significant stenosis.  There is some severe focal stenosis within an inferior division distal left MCA branch. MRI brain negative.  Hospitalist call for admission with pending neurology evaluation and will need ophthalmology evaluation for any central retinal artery occlusion.  Review of Systems:  Constitutional: No Weight Change, No Fever ENT/Mouth: No sore throat, No Rhinorrhea Eyes: No Eye Pain, +Vision Changes Cardiovascular: No Chest Pain, no SOB Respiratory: No Cough, No Sputum, No  Wheezing, no Dyspnea  Gastrointestinal: No Nausea, No Vomiting, No Diarrhea, No Constipation, No Pain Genitourinary: no Urinary Incontinence Musculoskeletal: No Arthralgias, No Myalgias Skin: No Skin Lesions, No Pruritus, Neuro: no Weakness, No Numbness,  No Loss of Consciousness, No Syncope Psych: No Anxiety/Panic, No Depression, no decrease appetite Heme/Lymph: No Bruising, No Bleeding  Past Medical History:  Diagnosis Date  . ADHD (attention deficit hyperactivity disorder)   . 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 Anxiety   . Aortic stenosis   . Constipation   . 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 Heart murmur   . Substance induced mood disorder (HCC) 10/04/2014    Past Surgical History:  Procedure Laterality Date  . CARDIAC SURGERY    . NASAL SEPTUM SURGERY       reports that he quit smoking about 21 months ago. His smoking use included cigarettes. He smoked 0.25 packs per day. He has never used smokeless tobacco. He reports previous alcohol use. He reports that he does not use drugs.  No Known Allergies  Family History  Problem Relation Age of Onset  . Hirschsprung's disease Neg Hx      Prior to Admission medications   Medication Sig Start Date End Date Taking? Authorizing Provider  busPIRone (BUSPAR) 10 MG tablet Take 10 mg by mouth at bedtime.    Yes [provider]  GuanFACINE HCl (INTUNIV) 3 MG TB24 Take 3 mg by mouth at bedtime.   Yes [provider]  mirtazapine (REMERON) 45 MG tablet Take 45 mg by mouth at bedtime.  Yes [provider]  warfarin (COUMADIN) 3 MG tablet Take 3 mg by mouth See admin instructions. Saturday and Sunday   Yes [provider]  warfarin (COUMADIN) 4 MG tablet Take 4 mg by mouth See admin instructions. Monday to Friday   Yes [provider]    Physical Exam: Vitals:   09/04/19 1400 09/04/19 1445 09/04/19 1515  09/04/19 2000  BP: 117/81 127/78 117/76 132/81  Pulse: 93 85 88 84  Resp: 15 20 (!) 21 (!) 23  Temp:      TempSrc:      SpO2: 98% 98% 97% 96%  Weight:      Height:        Constitutional: NAD, calm, comfortable, young male sitting upright in bed Vitals:   09/04/19 1400 09/04/19 1445 09/04/19 1515 09/04/19 2000  BP: 117/81 127/78 117/76 132/81  Pulse: 93 85 88 84  Resp: 15 20 (!) 21 (!) 23  Temp:      TempSrc:      SpO2: 98% 98% 97% 96%  Weight:      Height:       Eyes: PERRL, lids and conjunctivae normal ENMT: Mucous membranes are moist. Posterior pharynx clear of any exudate or lesions. Neck: normal, supple Respiratory: clear to auscultation bilaterally, no wheezing, no crackles. Normal respiratory effort. No accessory muscle use.  Cardiovascular: Regular rate and rhythm, no murmurs / rubs / gallops. No extremity edema. Abdomen: no tenderness, no masses palpated.  Bowel sounds positive.  Musculoskeletal: no clubbing / cyanosis. No joint deformity upper and lower extremities. Good ROM, no contractures. Normal muscle tone.  Skin: no rashes, lesions, ulcers. No induration Neurologic: CN 2-12 grossly intact.  No nystagmus.  Normal extraocular motility test.  Intact finger-nose.  Intact heel-to-shin.  Sensation intact. Strength 5/5 in all 4.  Psychiatric: Normal judgment and insight. Alert and oriented x 3. Normal mood.     Labs on Admission: I have personally reviewed following labs and imaging studies  CBC: Recent Labs  Lab 09/04/19 1212  WBC 10.0  HGB 15.0  HCT 43.4  MCV 89.5  PLT 289   Basic Metabolic Panel: Recent Labs  Lab 09/04/19 1212  NA 136  K 3.7  CL 103  CO2 25  GLUCOSE 86  BUN 8  CREATININE 1.04  CALCIUM 9.1   GFR: Estimated Creatinine Clearance: 136.2 mL/min (by C-G formula based on SCr of 1.04 mg/dL). Liver Function Tests: Recent Labs  Lab 09/04/19 1212  AST 35  ALT 43  ALKPHOS 88  BILITOT 0.8  PROT 7.3  ALBUMIN 4.6   No results for  input(s): LIPASE, AMYLASE in the last 168 hours. No results for input(s): AMMONIA in the last 168 hours. Coagulation Profile: Recent Labs  Lab 09/04/19 1212  INR 2.3*   Cardiac Enzymes: No results for input(s): CKTOTAL, CKMB, CKMBINDEX, TROPONINI in the last 168 hours. BNP (last 3 results) No results for input(s): PROBNP in the last 8760 hours. HbA1C: No results for input(s): HGBA1C in the last 72 hours. CBG: Recent Labs  Lab 09/04/19 1331  GLUCAP 78   Lipid Profile: No results for input(s): CHOL, HDL, LDLCALC, TRIG, CHOLHDL, LDLDIRECT in the last 72 hours. Thyroid Function Tests: No results for input(s): TSH, T4TOTAL, FREET4, T3FREE, THYROIDAB in the last 72 hours. Anemia Panel: No results for input(s): VITAMINB12, FOLATE, FERRITIN, TIBC, IRON, RETICCTPCT in the last 72 hours. Urine analysis: No results found for: COLORURINE, APPEARANCEUR, LABSPEC, PHURINE, GLUCOSEU, HGBUR, BILIRUBINUR, KETONESUR, PROTEINUR, UROBILINOGEN, NITRITE, LEUKOCYTESUR  Radiological Exams on Admission: CT Angio Head W or Wo Contrast  Result Date: 09/04/2019 CLINICAL DATA:  Vision loss, monocular. Additional provided: Sudden onset complete loss of vision in right eye earlier today lasting 5-7 minutes, patient also reports significant frontal "pressure" , mechanical heart valve on warfarin. EXAM: CT ANGIOGRAPHY HEAD AND NECK TECHNIQUE: Multidetector CT imaging of the head and neck was performed using the standard protocol during bolus administration of intravenous contrast. Multiplanar CT image reconstructions and MIPs were obtained to evaluate the vascular anatomy. Carotid stenosis measurements (when applicable) are obtained utilizing NASCET criteria, using the distal internal carotid diameter as the denominator. CONTRAST:  30mL OMNIPAQUE IOHEXOL 350 MG/ML SOLN COMPARISON:  Noncontrast head CT performed earlier the same day 09/04/2019, noncontrast head CT 05/20/2009. FINDINGS: CTA NECK FINDINGS Aortic arch:  Standard aortic branching the visualized aortic arch is unremarkable. No hemodynamically significant innominate or proximal subclavian artery stenosis. Right carotid system: CCA and ICA smooth patent within the neck without stenosis. Left carotid system: CCA and ICA smooth and patent within the neck without stenosis Vertebral arteries: Codominant and patent within the neck without stenosis. Skeleton: No acute bony abnormality or aggressive osseous lesion. Other neck: No soft tissue neck mass. There is nonspecific bilateral cervical lymphadenopathy for instance, a supraclavicular lymph node within the left lower neck measures 14 mm in short axis (series 7, image 200) and a right level I lymph node measures 13 mm in short axis (series 6 image 64) Upper chest: No consolidation within the imaged lung apices. Prior median sternotomy. Review of the MIP images confirms the above findings CTA HEAD FINDINGS Anterior circulation: The intracranial internal carotid arteries are patent. The M1 middle cerebral arteries are patent without significant stenosis. No M2 proximal branch occlusion or high-grade proximal stenosis is identified. Apparent severe stenosis within an inferior division distal M2 left MCA branch vessel of uncertain etiology (series 10, image 31). The anterior cerebral arteries are patent. No intracranial aneurysm is identified. Posterior circulation: The intracranial vertebral arteries are patent without significant stenosis, as is the basilar artery. The posterior cerebral arteries are patent bilaterally without significant proximal stenosis. Posterior communicating arteries are hypoplastic or absent bilaterally. Venous sinuses: Within limitations of contrast timing, no convincing thrombus. Anatomic variants: As described Review of the MIP images confirms the above findings IMPRESSION: CTA neck: 1. The common carotid, internal carotid and vertebral arteries are patent within the neck without hemodynamically  significant stenosis. No evidence of dissection. 2. Nonspecific bilateral cervical lymphadenopathy. A lymphoproliferative process such as lymphoma cannot be excluded. Clinical correlation and follow-up recommended with imaging follow-up as warranted. CTA head: 1. No intracranial large vessel occlusion or proximal high-grade arterial stenosis. 2. 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 Electronically Signed   By: Jackey Loge DO   On: 09/04/2019 16:34   CT HEAD WO CONTRAST  Result Date: 09/04/2019 CLINICAL DATA:  Right eye blindness. EXAM: CT HEAD WITHOUT CONTRAST TECHNIQUE: Contiguous axial images were obtained from the base of the skull through the vertex without intravenous contrast. COMPARISON:  May 20, 2009. FINDINGS: Brain: No evidence of acute infarction, hemorrhage, hydrocephalus, extra-axial collection or mass lesion/mass effect. Vascular: No hyperdense vessel or unexpected calcification. Skull: Normal. Negative for fracture or focal lesion. Sinuses/Orbits: No acute finding. Other: None. IMPRESSION: Normal head CT. Electronically Signed   By: Lupita Raider M.D.   On: 09/04/2019 13:54   CT Angio Neck W and/or Wo Contrast  Result Date: 09/04/2019 CLINICAL DATA:  Vision loss, monocular. Additional provided: Sudden onset complete loss of vision in right eye earlier today lasting 5-7 minutes, patient also reports significant frontal "pressure" , mechanical heart valve on warfarin. EXAM: CT ANGIOGRAPHY HEAD AND NECK TECHNIQUE: Multidetector CT imaging of the head and neck was performed using the standard protocol during bolus administration of intravenous contrast. Multiplanar CT image reconstructions and MIPs were obtained to evaluate the vascular anatomy. Carotid stenosis measurements (when applicable) are obtained utilizing NASCET criteria, using the distal internal carotid diameter as the denominator. CONTRAST:  75mL  OMNIPAQUE IOHEXOL 350 MG/ML SOLN COMPARISON:  Noncontrast head CT performed earlier the same day 09/04/2019, noncontrast head CT 05/20/2009. FINDINGS: CTA NECK FINDINGS Aortic arch: Standard aortic branching the visualized aortic arch is unremarkable. No hemodynamically significant innominate or proximal subclavian artery stenosis. Right carotid system: CCA and ICA smooth patent within the neck without stenosis. Left carotid system: CCA and ICA smooth and patent within the neck without stenosis Vertebral arteries: Codominant and patent within the neck without stenosis. Skeleton: No acute bony abnormality or aggressive osseous lesion. Other neck: No soft tissue neck mass. There is nonspecific bilateral cervical lymphadenopathy for instance, a supraclavicular lymph node within the left lower neck measures 14 mm in short axis (series 7, image 200) and a right level I lymph node measures 13 mm in short axis (series 6 image 64) Upper chest: No consolidation within the imaged lung apices. Prior median sternotomy. Review of the MIP images confirms the above findings CTA HEAD FINDINGS Anterior circulation: The intracranial internal carotid arteries are patent. The M1 middle cerebral arteries are patent without significant stenosis. No M2 proximal branch occlusion or high-grade proximal stenosis is identified. Apparent severe stenosis within an inferior division distal M2 left MCA branch vessel of uncertain etiology (series 10, image 31). The anterior cerebral arteries are patent. No intracranial aneurysm is identified. Posterior circulation: The intracranial vertebral arteries are patent without significant stenosis, as is the basilar artery. The posterior cerebral arteries are patent bilaterally without significant proximal stenosis. Posterior communicating arteries are hypoplastic or absent bilaterally. Venous sinuses: Within limitations of contrast timing, no convincing thrombus. Anatomic variants: As described Review of  the MIP images confirms the above findings IMPRESSION: CTA neck: 1. The common carotid, internal carotid and vertebral arteries are patent within the neck without hemodynamically significant stenosis. No evidence of dissection. 2. Nonspecific bilateral cervical lymphadenopathy. A lymphoproliferative process such as lymphoma cannot be excluded. Clinical correlation and follow-up recommended with imaging follow-up as warranted. CTA head: 1. No intracranial large vessel occlusion or proximal high-grade arterial stenosis. 2. 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 Electronically Signed   By: Jackey LogeKyle  Golden DO   On: 09/04/2019 16:34   MR BRAIN WO CONTRAST  Result Date: 09/04/2019 CLINICAL DATA:  Episode of right vision loss EXAM: MRI HEAD WITHOUT CONTRAST TECHNIQUE: Multiplanar, multiecho pulse sequences of the brain and surrounding structures were obtained without intravenous contrast. COMPARISON:  None. FINDINGS: Brain: There is no acute infarction or intracranial hemorrhage. There is no intracranial mass, mass effect, or edema. There is no hydrocephalus or extra-axial fluid collection. Vascular: Major vessel flow voids at the skull base are preserved. Skull and upper cervical spine: Normal marrow signal is preserved. Sinuses/Orbits: Paranasal sinuses are aerated. Orbits are unremarkable. Other: Sella is unremarkable.  Mastoid air cells are clear. IMPRESSION: Normal MRI of the brain. Electronically Signed   By: Elaina HoopsPraneil  Patel M.D.   On: 09/04/2019 17:23      Assessment/Plan  Amaurosis fugax of right eye w/ hx of TAVR and subtherapeutic INR Suspect cardioembolic source. Keep on telemetry.  Negative CTA head and neck and MRI brain  Last echo in 6/15 with no abnormal findings  Awaiting further neurology recommendations  Will need ophthalmology evaluation in the morning  Subtherapeutic INR w/ TAVR INR 2.3 today.  Goal of  2.5-3 Continue Coumadin per pharmacy  ADHD hold Guanfacine while inpatient  Anxiety/depression continue buspar and mirtazapine   DVT prophylaxis:.warfarin Code Status: Full Family Communication: Plan discussed with patient and wife at bedside  disposition Plan: Home with observation Consults called:  Admission status: Observation  Status is: Observation  The patient remains OBS appropriate and will d/c before 2 midnights.  Dispo: The patient is from: Home              Anticipated d/c is to: Home              Anticipated d/c date is: 1 day              Patient currently is not medically stable to d/c.         Anselm Jungling DO [facility]   If 7PM-7AM, please contact night-coverage [email or web address]   09/04/2019, 8:02 PM

## 2019-09-04 NOTE — ED Triage Notes (Signed)
Patient arrives to ED with complaints of acutely losing the vision in his right eye for 5 minutes while at work today. Patient states his vision is back to normal now and this event occurred at 1045 today. No other neuro deficients during event. Patient states his work is hot and they have no A/C. Patient on warfarin for mechanical aortic valve.

## 2019-09-04 NOTE — ED Notes (Signed)
Pt transported to MRI 

## 2019-09-05 ENCOUNTER — Observation Stay (HOSPITAL_BASED_OUTPATIENT_CLINIC_OR_DEPARTMENT_OTHER): Payer: BC Managed Care – PPO

## 2019-09-05 DIAGNOSIS — I6389 Other cerebral infarction: Secondary | ICD-10-CM | POA: Diagnosis not present

## 2019-09-05 DIAGNOSIS — F909 Attention-deficit hyperactivity disorder, unspecified type: Secondary | ICD-10-CM | POA: Diagnosis not present

## 2019-09-05 DIAGNOSIS — I639 Cerebral infarction, unspecified: Secondary | ICD-10-CM | POA: Diagnosis present

## 2019-09-05 DIAGNOSIS — G453 Amaurosis fugax: Secondary | ICD-10-CM | POA: Diagnosis not present

## 2019-09-05 LAB — HEMOGLOBIN A1C
Hgb A1c MFr Bld: 4.5 % — ABNORMAL LOW (ref 4.8–5.6)
Mean Plasma Glucose: 82.45 mg/dL

## 2019-09-05 LAB — PROTIME-INR
INR: 2.3 — ABNORMAL HIGH (ref 0.8–1.2)
Prothrombin Time: 24.8 seconds — ABNORMAL HIGH (ref 11.4–15.2)

## 2019-09-05 LAB — ECHOCARDIOGRAM COMPLETE
AR max vel: 1.98 cm2
AV Area VTI: 2.06 cm2
AV Area mean vel: 2.25 cm2
AV Mean grad: 13 mmHg
AV Peak grad: 25.6 mmHg
Ao pk vel: 2.53 m/s
Area-P 1/2: 3.5 cm2
Calc EF: 59.7 %
Height: 70 in
S' Lateral: 3.5 cm
Single Plane A2C EF: 62.7 %
Single Plane A4C EF: 55.3 %
Weight: 3545 oz

## 2019-09-05 LAB — LIPID PANEL
Cholesterol: 191 mg/dL (ref 0–200)
HDL: 31 mg/dL — ABNORMAL LOW (ref >40)
LDL Cholesterol: 144 mg/dL — ABNORMAL HIGH (ref 0–99)
Total CHOL/HDL Ratio: 6.2 RATIO
Triglycerides: 82 mg/dL (ref <150)
VLDL: 16 mg/dL (ref 0–40)

## 2019-09-05 LAB — CBC
HCT: 43.5 % (ref 39.0–52.0)
Hemoglobin: 14.6 g/dL (ref 13.0–17.0)
MCH: 30.7 pg (ref 26.0–34.0)
MCHC: 33.6 g/dL (ref 30.0–36.0)
MCV: 91.6 fL (ref 80.0–100.0)
Platelets: 283 10*3/uL (ref 150–400)
RBC: 4.75 MIL/uL (ref 4.22–5.81)
RDW: 12.3 % (ref 11.5–15.5)
WBC: 9.3 10*3/uL (ref 4.0–10.5)
nRBC: 0 % (ref 0.0–0.2)

## 2019-09-05 MED ORDER — WARFARIN SODIUM 5 MG PO TABS: 5.0000 mg | ORAL_TABLET | Freq: Once | ORAL | Status: DC

## 2019-09-05 NOTE — Plan of Care (Signed)
  Problem: Education: Goal: Knowledge of disease or condition will improve Outcome: Progressing Goal: Knowledge of secondary prevention will improve Outcome: Progressing Goal: Knowledge of patient specific risk factors addressed and post discharge goals established will improve Outcome: Progressing   Problem: Coping: Goal: Will verbalize positive feelings about self Outcome: Progressing   Problem: Health Behavior/Discharge Planning: Goal: Ability to manage health-related needs will improve Outcome: Progressing   Problem: Self-Care: Goal: Ability to participate in self-care as condition permits will improve Outcome: Progressing Goal: Verbalization of feelings and concerns over difficulty with self-care will improve Outcome: Progressing Goal: Ability to communicate needs accurately will improve Outcome: Progressing   Problem: Ischemic Stroke/TIA Tissue Perfusion: Goal: Complications of ischemic stroke/TIA will be minimized Outcome: Progressing   Problem: Spontaneous Subarachnoid Hemorrhage Tissue Perfusion: Goal: Complications of Spontaneous Subarachnoid Hemorrhage will be minimized Outcome: Progressing

## 2019-09-05 NOTE — Progress Notes (Signed)
Noted positive SARS result on admission 09/04/19. Pt confirmed he had originally tested positive for Covid-19 on May 14th. 2021 at another healthcare facility. He confirms he has been asymptomatic. MD McClung notified and advised this RN that it was OK for him to remain without infection precautions.

## 2019-09-05 NOTE — Evaluation (Signed)
Speech Language Pathology Evaluation Patient Details Name: Adam Bray MRN: [id number] DOB: [birth year] Today's Date: 09/05/2019 Time: 1130-1150 SLP Time Calculation (min) (ACUTE ONLY): 20 min  Problem List:  Patient Active Problem List   Diagnosis Date Noted  . Ischemic cerebrovascular accident (CVA) (HCC) 09/05/2019  . Amaurosis fugax of right eye 09/04/2019  . Subtherapeutic international normalized ratio (INR) 09/04/2019  . ADHD 09/04/2019  . 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 Substance induced mood disorder (HCC) 10/04/2014  . MDD (major depressive disorder), recurrent episode, moderate (HCC) 10/04/2014   Past Medical History:  Past Medical History:  Diagnosis Date  . ADHD (attention deficit hyperactivity disorder)   . 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 Anxiety   . Aortic stenosis   . Constipation   . 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 Heart murmur   . Substance induced mood disorder (HCC) 10/04/2014   Past Surgical History:  Past Surgical History:  Procedure Laterality Date  . CARDIAC SURGERY    . NASAL SEPTUM SURGERY     HPI:  24 year old male with mechanical aortic valve, on warfarin, who presents after a 5 minute episode of amaurosis fugax OD.  MRI negative.    Assessment / Plan / Recommendation Clinical Impression  Cognitive-linguistic evaluation was completed at the pt's request in order to more thoroughly assess his current level of function. He scored a 30/30 on the SLUMS, initially making a single error on the clock drawing task but with delayed recognition and self-correction without any assistance needed from SLP. Speech is clear and intelligible; comprehension appears to be intact and expressive language is fluent and appropriate. No acute SLP needs identified at this time.      SLP Assessment  SLP Recommendation/Assessment: Patient does not need any further Speech Lanaguage Pathology Services SLP Visit Diagnosis: Cognitive communication deficit (R41.841)    Follow Up Recommendations  None    Frequency and Duration           SLP Evaluation Cognition  Overall Cognitive Status: Within Functional Limits for tasks assessed Orientation Level: Oriented X4       Comprehension  Auditory Comprehension Overall Auditory Comprehension: Appears within functional limits for tasks assessed    Expression Expression Primary Mode of Expression: Verbal Verbal Expression Overall Verbal Expression: Appears within functional limits for tasks assessed   Oral / Motor  Motor Speech Overall Motor Speech: Appears within functional limits for tasks assessed   GO                    Mahala Menghini., M.A. CCC-SLP Acute Rehabilitation Services Pager [phone] Office [phone]  09/05/2019, 12:15 PM

## 2019-09-05 NOTE — Progress Notes (Signed)
ANTICOAGULATION CONSULT NOTE   Pharmacy Consult for Warfarin Indication: mechanical aortic valve replacement  No Known Allergies  Patient Measurements: Height: 5\' 10"  (177.8 cm) Weight: 100.5 kg (221 lb 9 oz) IBW/kg (Calculated) : 73  Vital Signs: Temp: 98.3 F (36.8 C) (07/17 0816) Temp Source: Oral (07/17 0816) BP: 113/65 (07/17 0816) Pulse Rate: 69 (07/17 0816)  Labs: Recent Labs    09/04/19 1212 09/05/19 0617  HGB 15.0 14.6  HCT 43.4 43.5  PLT 289 283  APTT 45*  --   LABPROT 24.4* 24.8*  INR 2.3* 2.3*  CREATININE 1.04  --     Estimated Creatinine Clearance: 132.4 mL/min (by C-G formula based on SCr of 1.04 mg/dL).   Medical History: Past Medical History:  Diagnosis Date  . ADHD (attention deficit hyperactivity disorder)   . 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 Anxiety   . Aortic stenosis   . Constipation   . 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 Heart murmur   . Substance induced mood disorder (HCC) 10/04/2014    Medications:  Scheduled:  . busPIRone  10 mg Oral QHS  . mirtazapine  45 mg Oral QHS  . Warfarin - Pharmacist Dosing Inpatient   Does not apply q1600    Assessment: Patient is a 24yo male who presents with vision changes. PMH of mechanical aortic valve replacement (~2015).   INR today 2.3, which is subtherapeutic. Hgb 14.6, plt 283. Anticipating therapeutic INR after slight warfarin dose increase from PTA regimen.  PTA regimen: warfarin 4mg  Monday-Friday & 3mg  on Saturday & Sunday.   Goal of Therapy:  INR Goal 2.5-3 Monitor platelets by anticoagulation protocol: Yes   Plan:  INR subtherapeutic at 2.3 Give 5mg  warfarin x1  Check daily INR & CBC  , PharmD PGY-1 Acute Care Pharmacy Resident Office: [phone] 09/05/2019 11:09 AM

## 2019-09-05 NOTE — Consult Note (Signed)
NEURO HOSPITALIST CONSULT NOTE   Requesting physician: Dr. Cyndia Bentu  Reason for Consult: Amaurosis fugax, OD  History obtained from:  Patient and Chart    HPI:                                                                                                                                          Adam Bray is an 24 y.o. male with a PMHx of mechanical aortic valve in 2015 for bicuspid aortic valve with valve disease (on warfarin, with which he states he is compliant), ADHD, alcohol abuse, anxiety history of nasal septum surgery, who presented to the ED on Friday morning with a chief complaint of acutely losing the vision in his right eye at 10:45 AM while at work. The vision loss lasted for 5 minutes and was complete for the first 2 minutes, with slow resolution during which he began to regain vision starting from the corner of the visual field of his right eye. His vision was back to normal at the time of his presentation to Triage. The patient had no other neurological complaints except for a mild yet sharp frontal headache that was improving in the ED .   ED course:  MRI brain: Normal  INR 2.3  Past Medical History:  Diagnosis Date  . ADHD (attention deficit hyperactivity disorder)   . 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 Anxiety   . Aortic stenosis   . Constipation   . 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 Heart murmur   . Substance induced mood disorder (HCC) 10/04/2014    Past Surgical History:  Procedure Laterality Date  . CARDIAC SURGERY    . NASAL SEPTUM SURGERY      Family History  Problem Relation Age of Onset  . Hirschsprung's disease Neg Hx               Social History:  reports that he quit smoking about 21 months ago. His smoking use included cigarettes. He smoked 0.25 packs per day. He has never used smokeless tobacco. He reports previous alcohol use. He reports  that he does not use drugs.  No Known Allergies  MEDICATIONS:  Prior to Admission:  Medications Prior to Admission  Medication Sig Dispense Refill Last Dose  . busPIRone (BUSPAR) 10 MG tablet Take 10 mg by mouth at bedtime.    09/03/2019 at Unknown time  . GuanFACINE HCl (INTUNIV) 3 MG TB24 Take 3 mg by mouth at bedtime.   09/03/2019 at Unknown time  . mirtazapine (REMERON) 45 MG tablet Take 45 mg by mouth at bedtime.   09/03/2019 at Unknown time  . warfarin (COUMADIN) 3 MG tablet Take 3 mg by mouth See admin instructions. Saturday and Sunday   08/30/2019  . warfarin (COUMADIN) 4 MG tablet Take 4 mg by mouth See admin instructions. Monday to Friday   09/03/2019 at 2100   Scheduled: . busPIRone  10 mg Oral QHS  . mirtazapine  45 mg Oral QHS  . Warfarin - Pharmacist Dosing Inpatient   Does not apply q1600     ROS:                                                                                                                                       No fever, chills, confusion, limb weakness or numbness, CP, SOB or confusion. Other ROS as per HPI.    Blood pressure 128/84, pulse 97, temperature 97.9 F (36.6 C), temperature source Oral, resp. rate (!) 21, height 5\' 10"  (1.778 m), weight 100.5 kg, SpO2 99 %.   General Examination:                                                                                                       Physical Exam  HEENT-  [place]/AT   Lungs- Respirations unlabored Extremities- No edema  Neurological Examination Mental Status: Alert, fully oriented, thought content appropriate.  Speech fluent with intact comprehension and naming.  Able to follow all commands without difficulty. Cranial Nerves: II: Visual [place] intact. PERRL. Visual acuity 20/20 OD, 20/15 OS with glasses on.   III,IV, VI: No ptosis. EOMI. No nystagmus.   V,VII: Smile symmetric,  facial temp sensation equal bilaterally VIII: Hearing intact to voice IX,X: No hypophonia XI: Symmetric shoulder shrug XII: Midline tongue extension Motor: Right : Upper extremity   5/5    Left:     Upper extremity   5/5  Lower extremity   5/5     Lower extremity   5/5 No pronator drift Sensory: Temp and light touch intact throughout, bilaterally Deep Tendon Reflexes: 2+ and symmetric throughout Cerebellar: No ataxia with FNF bilaterally  Gait:  Deferred   Lab Results: Basic Metabolic Panel: Recent Labs  Lab 09/04/19 1212  NA 136  K 3.7  CL 103  CO2 25  GLUCOSE 86  BUN 8  CREATININE 1.04  CALCIUM 9.1    CBC: Recent Labs  Lab 09/04/19 1212  WBC 10.0  HGB 15.0  HCT 43.4  MCV 89.5  PLT 289    Cardiac Enzymes: No results for input(s): CKTOTAL, CKMB, CKMBINDEX, TROPONINI in the last 168 hours.  Lipid Panel: No results for input(s): CHOL, TRIG, HDL, CHOLHDL, VLDL, LDLCALC in the last 168 hours.  Imaging: CT Angio Head W or Wo Contrast  Result Date: 09/04/2019 CLINICAL DATA:  Vision loss, monocular. Additional provided: Sudden onset complete loss of vision in right eye earlier today lasting 5-7 minutes, patient also reports significant frontal "pressure" , mechanical heart valve on warfarin. EXAM: CT ANGIOGRAPHY HEAD AND NECK TECHNIQUE: Multidetector CT imaging of the head and neck was performed using the standard protocol during bolus administration of intravenous contrast. Multiplanar CT image reconstructions and MIPs were obtained to evaluate the vascular anatomy. Carotid stenosis measurements (when applicable) are obtained utilizing NASCET criteria, using the distal internal carotid diameter as the denominator. CONTRAST:  56mL OMNIPAQUE IOHEXOL 350 MG/ML SOLN COMPARISON:  Noncontrast head CT performed earlier the same day 09/04/2019, noncontrast head CT 05/20/2009. FINDINGS: CTA NECK FINDINGS Aortic arch: Standard aortic branching the visualized aortic arch is  unremarkable. No hemodynamically significant innominate or proximal subclavian artery stenosis. Right carotid system: CCA and ICA smooth patent within the neck without stenosis. Left carotid system: CCA and ICA smooth and patent within the neck without stenosis Vertebral arteries: Codominant and patent within the neck without stenosis. Skeleton: No acute bony abnormality or aggressive osseous lesion. Other neck: No soft tissue neck mass. There is nonspecific bilateral cervical lymphadenopathy for instance, a supraclavicular lymph node within the left lower neck measures 14 mm in short axis (series 7, image 200) and a right level I lymph node measures 13 mm in short axis (series 6 image 64) Upper chest: No consolidation within the imaged lung apices. Prior median sternotomy. Review of the MIP images confirms the above findings CTA HEAD FINDINGS Anterior circulation: The intracranial internal carotid arteries are patent. The M1 middle cerebral arteries are patent without significant stenosis. No M2 proximal branch occlusion or high-grade proximal stenosis is identified. Apparent severe stenosis within an inferior division distal M2 left MCA branch vessel of uncertain etiology (series 10, image 31). The anterior cerebral arteries are patent. No intracranial aneurysm is identified. Posterior circulation: The intracranial vertebral arteries are patent without significant stenosis, as is the basilar artery. The posterior cerebral arteries are patent bilaterally without significant proximal stenosis. Posterior communicating arteries are hypoplastic or absent bilaterally. Venous sinuses: Within limitations of contrast timing, no convincing thrombus. Anatomic variants: As described Review of the MIP images confirms the above findings IMPRESSION: CTA neck: 1. The common carotid, internal carotid and vertebral arteries are patent within the neck without hemodynamically significant stenosis. No evidence of dissection. 2.  Nonspecific bilateral cervical lymphadenopathy. A lymphoproliferative process such as lymphoma cannot be excluded. Clinical correlation and follow-up recommended with imaging follow-up as warranted. CTA head: 1. No intracranial large vessel occlusion or proximal high-grade arterial stenosis. 2. 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 Electronically Signed   By: Jackey Loge DO   On: 09/04/2019 16:34   CT HEAD WO CONTRAST  Result Date: 09/04/2019 CLINICAL DATA:  Right eye blindness. EXAM: CT HEAD WITHOUT CONTRAST TECHNIQUE: Contiguous axial images were obtained from the base of the skull through the vertex without intravenous contrast. COMPARISON:  May 20, 2009. FINDINGS: Brain: No evidence of acute infarction, hemorrhage, hydrocephalus, extra-axial collection or mass lesion/mass effect. Vascular: No hyperdense vessel or unexpected calcification. Skull: Normal. Negative for fracture or focal lesion. Sinuses/Orbits: No acute finding. Other: None. IMPRESSION: Normal head CT. Electronically Signed   By: Lupita Raider M.D.   On: 09/04/2019 13:54   CT Angio Neck W and/or Wo Contrast  Result Date: 09/04/2019 CLINICAL DATA:  Vision loss, monocular. Additional provided: Sudden onset complete loss of vision in right eye earlier today lasting 5-7 minutes, patient also reports significant frontal "pressure" , mechanical heart valve on warfarin. EXAM: CT ANGIOGRAPHY HEAD AND NECK TECHNIQUE: Multidetector CT imaging of the head and neck was performed using the standard protocol during bolus administration of intravenous contrast. Multiplanar CT image reconstructions and MIPs were obtained to evaluate the vascular anatomy. Carotid stenosis measurements (when applicable) are obtained utilizing NASCET criteria, using the distal internal carotid diameter as the denominator. CONTRAST:  75mL OMNIPAQUE IOHEXOL 350 MG/ML SOLN COMPARISON:   Noncontrast head CT performed earlier the same day 09/04/2019, noncontrast head CT 05/20/2009. FINDINGS: CTA NECK FINDINGS Aortic arch: Standard aortic branching the visualized aortic arch is unremarkable. No hemodynamically significant innominate or proximal subclavian artery stenosis. Right carotid system: CCA and ICA smooth patent within the neck without stenosis. Left carotid system: CCA and ICA smooth and patent within the neck without stenosis Vertebral arteries: Codominant and patent within the neck without stenosis. Skeleton: No acute bony abnormality or aggressive osseous lesion. Other neck: No soft tissue neck mass. There is nonspecific bilateral cervical lymphadenopathy for instance, a supraclavicular lymph node within the left lower neck measures 14 mm in short axis (series 7, image 200) and a right level I lymph node measures 13 mm in short axis (series 6 image 64) Upper chest: No consolidation within the imaged lung apices. Prior median sternotomy. Review of the MIP images confirms the above findings CTA HEAD FINDINGS Anterior circulation: The intracranial internal carotid arteries are patent. The M1 middle cerebral arteries are patent without significant stenosis. No M2 proximal branch occlusion or high-grade proximal stenosis is identified. Apparent severe stenosis within an inferior division distal M2 left MCA branch vessel of uncertain etiology (series 10, image 31). The anterior cerebral arteries are patent. No intracranial aneurysm is identified. Posterior circulation: The intracranial vertebral arteries are patent without significant stenosis, as is the basilar artery. The posterior cerebral arteries are patent bilaterally without significant proximal stenosis. Posterior communicating arteries are hypoplastic or absent bilaterally. Venous sinuses: Within limitations of contrast timing, no convincing thrombus. Anatomic variants: As described Review of the MIP images confirms the above findings  IMPRESSION: CTA neck: 1. The common carotid, internal carotid and vertebral arteries are patent within the neck without hemodynamically significant stenosis. No evidence of dissection. 2. Nonspecific bilateral cervical lymphadenopathy. A lymphoproliferative process such as lymphoma cannot be excluded. Clinical correlation and follow-up recommended with imaging follow-up as warranted. CTA head: 1. No intracranial large vessel occlusion or proximal high-grade arterial stenosis. 2. 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 Electronically Signed   By: Jackey Loge DO   On: 09/04/2019 16:34   MR BRAIN WO CONTRAST  Result Date: 09/04/2019 CLINICAL DATA:  Episode of right vision loss EXAM: MRI HEAD WITHOUT CONTRAST TECHNIQUE: Multiplanar,  multiecho pulse sequences of the brain and surrounding structures were obtained without intravenous contrast. COMPARISON:  None. FINDINGS: Brain: There is no acute infarction or intracranial hemorrhage. There is no intracranial mass, mass effect, or edema. There is no hydrocephalus or extra-axial fluid collection. Vascular: Major vessel flow voids at the skull base are preserved. Skull and upper cervical spine: Normal marrow signal is preserved. Sinuses/Orbits: Paranasal sinuses are aerated. Orbits are unremarkable. Other: Sella is unremarkable.  Mastoid air cells are clear. IMPRESSION: Normal MRI of the brain. Electronically Signed   By: Guadlupe Spanish M.D.   On: 09/04/2019 17:23    Assessment: 24 year old male with mechanical aortic valve, on warfarin, who presents after a 5 minute episode of amaurosis fugax OD.   1. Exam reveals no focal abnormality 2. MRI brain normal 3. CTA neck: The common carotid, internal carotid and vertebral arteries are patent within the neck without hemodynamically significant stenosis. No evidence of dissection. Nonspecific bilateral cervical lymphadenopathy. A  lymphoproliferative process such as lymphoma cannot be excluded. 4. CTA head: No intracranial large vessel occlusion. Apparent severe focal stenosis within inferior division distal M2 left MCA branch vessel, indeterminate in etiology. 5. Overall impression is that the episode of amaurosis fugax was secondary to a cardioembolic event due to subtherapeutic INR.    Recommendations: 1. TTE. If negative will likely need TEE to further assess the mechanical valve.  2. Cardiology consult to assess whether long term risk/benefit ratio of bioprosthetic valve placement, taking into account the risks of surgery, would be more beneficial to the patient than the current mechanical valve 3. Cardiac telemetry 4. Continue warfarin. Pharmacy to dose.  5. Consult Neurointerventional Radiology for 4-vessel angiogram to further assess the severely stenotic distal left M2 branch seen on CTA.     Electronically signed: Dr. Caryl Pina 09/05/2019, 1:08 AM

## 2019-09-05 NOTE — Progress Notes (Signed)
  Echocardiogram 2D Echocardiogram has been performed.  Adam Bray 09/05/2019, 9:37 AM

## 2019-09-05 NOTE — Discharge Instructions (Signed)
Amaurosis Fugax Amaurosis fugax, also called transient visual loss, is a condition in which a person loses sight in one eye or, rarely, both eyes for a short time. The vision loss in the affected eye may be total or partial. The vision loss usually lasts for only a few seconds or minutes before sight returns to normal. In some cases, vision loss may last for several hours. This condition is typically caused by interruption of blood flow in the artery that supplies blood to the retina. The retina is the part of the eye that contains the nerves needed for sight. This condition can be a warning sign that a stroke may happen, either in the eye or in the brain. A stroke can result in permanent vision loss or loss of other body functions. What are the causes? This condition is caused by a loss or interruption of blood flow to the retinal artery. Causes for the change in blood flow include:  A buildup of cholesterol and fats, or plaque, in the artery (atherosclerosis). If any plaque breaks off and gets into the bloodstream, it can travel to other blood vessels, such as the retinal artery.  Diseases of the heart valves.  Certain blood conditions, such as sickle cell anemia, leukemia, and blood clotting (coagulation) disorders.  Inflammation of the arteries (vasculitis).  A fast or irregular heartbeat, such as atrial fibrillation.  Family history of stroke. What increases the risk? The following factors may make you more likely to develop this condition:  Use of any tobacco products, including cigarettes, chewing tobacco, or electronic cigarettes.  Poorly controlled diabetes.  Conditions that can lead to diseases of the heart and blood vessels (cardiovascular diseases), such as: ? High blood pressure (hypertension). ? High cholesterol.  Drinking too much alcohol regularly.  Use of drugs, especially cocaine.  Age. The risk increases with age. What are the signs or symptoms? The main symptom  of this condition is painless, sudden loss of vision in one eye. The vision loss often starts at the top and moves down, as if a curtain is being pulled down over your eye. This is usually followed by a quick return of vision. However, symptoms may last for several hours. It is important to seek medical care right away even if your symptoms go away. How is this diagnosed? This condition is diagnosed by:  Medical history and physical exam.  Eye exam, including dilating drops and looking at the back of your eyes.  Carotid ultrasound. This checks for plaque in the carotid arteries in your neck.  Magnetic resonance angiography (MRA). This checks the carotid artery and the branches that supply the brain. MRA looks for areas of blockage or disease. You may also have other tests, including:  Blood tests.  Electrocardiogram (ECG) to check your heart rhythm.  Echocardiogram (ECHO) to check your heart function. How is this treated? Emergency treatment for this condition may involve massaging the eyeball or using certain breathing techniques to remove or ease the blockage of the retinal artery. You may also get an emergency referral to a clinic or medical center that treats strokes. Other treatments focus on reducing your risk of having a stroke in the future. These may include:  Medicines, such as medicines to manage high blood pressure, diabetes, or cholesterol, or to thin the blood.  Surgical procedures, such as: ? Carotid endarterectomy to remove plaque from the carotid artery. ? Carotid angioplasty and placement of a small, mesh tube (stenting) to open the blocked part of  the artery.  Lifestyle changes, such as stopping tobacco use, changing your diet, and getting enough exercise. Follow these instructions at home: Medicines  Take over-the-counter and prescription medicines only as told by your health care provider.  If you are taking blood thinners: ? Talk with your health care provider  before you take any medicines that contain aspirin or NSAIDs, such as ibuprofen. These medicines increase your risk for dangerous bleeding. ? Take your medicine exactly as told, at the same time every day. ? Avoid activities that could cause injury or bruising, and follow instructions about how to prevent falls. ? Wear a medical alert bracelet or carry a card that lists what medicines you take. Eating and drinking   Eat a diet that includes five or more servings of fruits and vegetables each day. This may reduce the risk of stroke.  Certain diets may help with high blood pressure, high cholesterol, diabetes, or obesity. These include: ? A diet that is low in salt (sodium) to manage high blood pressure. ? A high-fiber diet that is low in saturated fat, trans fat, and cholesterol to control cholesterol levels. ? A low-carbohydrate, low-sugar diet to manage diabetes. ? A reduced-calorie diet that is low in sodium, saturated fat, trans fat, and cholesterol to manage obesity. Lifestyle   Maintain a healthy weight.  Stay physically active. Try to get at least 30 minutes of activity on most or all days.  Do not use any products that contain nicotine or tobacco, such as cigarettes, e-cigarettes, and chewing tobacco. If you need help quitting, ask your health care provider.  Do not misuse drugs. General instructions  Do not drink alcohol if: ? Your health care provider tells you not to drink. ? You are pregnant, may be pregnant, or are planning to become pregnant.  If you drink alcohol: ? Limit how much you use to:  0-1 drink a day for women.  0-2 drinks a day for men. ? Be aware of how much alcohol is in your drink. In the [place] one drink equals one 12 oz bottle of beer (355 mL), one 5 oz glass of wine (148 mL), or one 1 oz glass of hard liquor (44 mL).  Keep all follow-up visits as told by your health care provider. This is important. Contact a health care provider if:  You lose  vision in one eye or both eyes. Get help right away if:   You have chest pain or an irregular heartbeat.  You have any symptoms of a stroke. "BE FAST" is an easy way to remember the main warning signs of a stroke. ? B - Balance. Signs are dizziness, sudden trouble walking, or loss of balance. ? E - Eyes. Signs are trouble seeing or a sudden change in vision. ? F - Face. Signs are sudden weakness or numbness of the face, or the face or eyelid drooping on one side. ? A - Arms. Signs are weakness or numbness in an arm. This happens suddenly and usually on one side of the body. ? S - Speech. Signs are sudden trouble speaking, slurred speech, or trouble understanding what people say. ? T - Time. Time to call emergency services. Write down what time symptoms started.  You have other signs of a stroke, such as: ? A sudden, severe headache with no known cause. ? Nausea or vomiting. ? Seizure. These symptoms may represent a serious problem that is an emergency. Do not wait to see if the symptoms will go away.  Get medical help right away. Call your local emergency services (911 in the [place] Do not drive yourself to the hospital. Summary  Amaurosis fugax is a condition in which you lose sight for a short time.  This condition can be a warning sign that a stroke may happen. A stroke can result in permanent vision loss or loss of other body functions.  Seek medical care right away even if your symptoms go away. This information is not intended to replace advice given to you by your health care provider. Make sure you discuss any questions you have with your health care provider. Document Revised: 08/20/2018 Document Reviewed: 08/20/2018 Elsevier Patient Education  2020 [employer].

## 2019-09-05 NOTE — Progress Notes (Signed)
STROKE TEAM PROGRESS NOTE   HISTORY OF PRESENT ILLNESS (per record) Adam Bray is an 24 y.o. male with a PMHx of mechanical aortic valve in 2015 for bicuspid aortic valve with valve disease (on warfarin, with which he states he is compliant), ADHD, alcohol abuse, anxiety history of nasal septum surgery, who presented to the ED on Friday morning with a chief complaint of acutely losing the vision in his right eye at 10:45 AM while at work. The vision loss lasted for 5 minutes and was complete for the first 2 minutes, with slow resolution during which he began to regain vision starting from the corner of the visual field of his right eye. His vision was back to normal at the time of his presentation to Triage. The patient had no other neurological complaints except for a mild yet sharp frontal headache that was improving in the ED .  ED course: MRI brain: Normal INR 2.3   INTERVAL HISTORY His wife and mother are  at the bedside. I personally reviewed history of present illness with the patient and family, reviewed electronic medical records and imaging films in PACS. He presented with transient episode of painless loss of vision in the right eye for a couple of minutes only. He had mild sharp right frontal headache which was short lasting. MRI scan of the brain is negative for acute stroke. CT angiogram shows decrease caliber of inferior division of left M2. LDL cholesterol is 144 mg percent hemoglobin A1c is 4.5. Echocardiogram shows normal ejection fraction with well-functioning mechanical prosthetic aortic valve. No evidence of clot or endocarditis.    OBJECTIVE Vitals:   09/04/19 2307 09/05/19 0300 09/05/19 0816 09/05/19 1206  BP: 128/84 122/61 113/65 128/80  Pulse: 97 62 69 79  Resp:  (!) 21 18 18   Temp: 97.9 F (36.6 C) 98.1 F (36.7 C) 98.3 F (36.8 C) 98 F (36.7 C)  TempSrc: Oral Oral Oral Oral  SpO2: 99% 97% 99% 97%  Weight: 100.5 kg     Height: 5\' 10"  (1.778 m)       CBC:   Recent Labs  Lab 09/04/19 1212 09/05/19 0617  WBC 10.0 9.3  HGB 15.0 14.6  HCT 43.4 43.5  MCV 89.5 91.6  PLT 289 283    Basic Metabolic Panel:  Recent Labs  Lab 09/04/19 1212  NA 136  K 3.7  CL 103  CO2 25  GLUCOSE 86  BUN 8  CREATININE 1.04  CALCIUM 9.1    Lipid Panel:     Component Value Date/Time   CHOL 191 09/05/2019 0617   TRIG 82 09/05/2019 0617   HDL 31 (L) 09/05/2019 0617   CHOLHDL 6.2 09/05/2019 0617   VLDL 16 09/05/2019 0617   LDLCALC 144 (H) 09/05/2019 0617   HgbA1c:  Lab Results  Component Value Date   HGBA1C 4.5 (L) 09/05/2019   Urine Drug Screen:     Component Value Date/Time   LABOPIA NONE DETECTED 10/03/2014 1420   COCAINSCRNUR NONE DETECTED 10/03/2014 1420   LABBENZ NONE DETECTED 10/03/2014 1420   AMPHETMU POSITIVE (A) 10/03/2014 1420   THCU NONE DETECTED 10/03/2014 1420   LABBARB NONE DETECTED 10/03/2014 1420    Alcohol Level     Component Value Date/Time   ETH 98 (H) 10/03/2014 1010    IMAGING  CT Angio Head W or Wo Contrast CT Angio Neck W and/or Wo Contrast 09/04/2019 IMPRESSION:   CTA neck:  1. The common carotid, internal carotid and vertebral arteries  are patent within the neck without hemodynamically significant stenosis. No evidence of dissection.  2. Nonspecific bilateral cervical lymphadenopathy. A lymphoproliferative process such as lymphoma cannot be excluded. Clinical correlation and follow-up recommended with imaging follow-up as warranted.   CTA head:  1. No intracranial large vessel occlusion or proximal high-grade arterial stenosis.  2. 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   CT HEAD WO CONTRAST 09/04/2019 IMPRESSION:  Normal head CT.   MR BRAIN WO CONTRAST 09/04/2019 IMPRESSION:  Normal MRI of the brain.   ECHOCARDIOGRAM COMPLETE 09/05/2019 IMPRESSIONS   1. Left ventricular ejection fraction, by estimation, is 60 to  65%. The left ventricle has normal function. The left ventricle has no regional wall motion abnormalities. The left ventricular internal cavity size was mildly dilated. Left ventricular diastolic parameters were normal.   2. Right ventricular systolic function is normal. The right ventricular size is normal.   3. The mitral valve is grossly normal. Trivial mitral valve regurgitation.   4. AV prosthesis opens well Peak and mean gradients through the valve are 26 and 13 mm Hg respectively This is not significantly changed from echo report from 08/04/19 (reported by Blima Singer, [facility]). The aortic valve has been repaired/replaced. Aortic valve regurgitation is trivial. There is a valve present in the aortic position. Procedure Date: 2015.   5. Aortic dilatation noted. Aneurysm of the ascending aorta. There is mild dilatation of the ascending aorta.    PHYSICAL EXAM Blood pressure 128/80, pulse 79, temperature 98 F (36.7 C), temperature source Oral, resp. rate 18, height 5\' 10"  (1.778 m), weight 100.5 kg, SpO2 97 %. Pleasant young mildly obese Caucasian male not in distress. He has a lot of acne on his face. . Afebrile. Head is nontraumatic. Neck is supple without bruit.    Cardiac exam no murmur or gallop. Lungs are clear to auscultation. Distal pulses are well felt.  Neurological Exam ;  Awake  Alert oriented x 3. Normal speech and language.eye movements full without nystagmus.fundi were not visualized. Vision acuity and Weidinger appear normal. Hearing is normal. Palatal movements are normal. Face symmetric. Tongue midline. Normal strength, tone, reflexes and coordination. Normal sensation. Gait deferred.     ASSESSMENT/PLAN Adam Bray is a 24 y.o. male with history of mechanical aortic valve in 2015 for bicuspid aortic valve with valve disease (on warfarin, with which he states he is compliant - INR 2.3 in ED), ADHD, alcohol abuse, anxiety history of nasal septum surgery, who presented with an  episode of transient loss of vision. He did not receive IV t-PA due to resolution of deficits.    TIA: Likely embolic TIA from subtherapeutic INR (2.5 to 3.5 recommended for mechanical heart valve)  Resultant no deficits  Code Stroke CT Head - not ordered  CT head - normal  MRI head - normal  MRA head - not ordered  CTA H&N - Apparent severe focal stenosis within an inferior division distal M2 left MCA branch vessel, indeterminate in etiology.   CT Perfusion - not ordered  Carotid Doppler - CTA neck performed - carotid dopplers not indicated.  2D Echo - EF 60 - 65%. Prosthetic aortic valve  2016 Virus 2 - Positive  LDL - 144  HgbA1c - 4.5  UDS - not ordered  VTE prophylaxis - warfarin Diet  Diet Order            Diet Heart Room service appropriate? Yes;  Fluid consistency: Thin  Diet effective now                  warfarin daily prior to admission, now on warfarin daily  Patient counseled to be compliant with his antithrombotic medications  Ongoing aggressive stroke risk factor management  Therapy recommendations:  pending  Disposition:  Pending  Hypertension  Home BP meds: none   Current BP meds: none  Stable . Permissive hypertension (OK if < 220/120) but gradually normalize in 5-7 days  . Long-term BP goal normotensive  Hyperlipidemia  Home Lipid lowering medication: none  LDL 144, goal < 70  Current lipid lowering medication: recommend Lipitor 80 mg daily if no contraindications from medical standpoint  Continue statin at discharge  Other Stroke Risk Factors  Former cigarette smoker - quit  Hx of ETOH abuse  Obesity, Body mass index is 31.79 kg/m., recommend weight loss, diet and exercise as appropriate   Mechanical aortic heart valve  Other Active Problems  Code status - Full code  SARs positive  Nonspecific bilateral cervical lymphadenopathy. A lymphoproliferative process such as lymphoma cannot be excluded.  Clinical correlation and follow-up recommended with imaging follow-up as warranted.   Hospital day # 0 he presented with transient painless right eye vision loss likely amaurosis fuhax secondary to mechanical heart valve with slightly suboptimal anticoagulation with warfarin. Recommend tighter anticoagulation with INR goal between 2.5 and 3.5. Patient counseled to be compliant with his diet and to control the greens in his diet. Also concern with her acne medications may be interfering with his warfarin. Follow-up with his cardiologist at [facility] as outpatient. Start full dose statin for his elevated LDL and strong family history of hyperlipidemia. Long discussion with patient, wife and mother and answered questions. Greater than 50% time during this 35-minute visit was spent on counseling and coordination of care and discussion with care team. Discussed with Dr. Shaune Spittle, MD To contact Stroke Continuity provider, please refer to [email or web address]. After hours, contact General Neurology

## 2019-09-05 NOTE — Discharge Summary (Addendum)
DISCHARGE SUMMARY  Adam Bray  MR#: [id number]  DOB:1995/03/12  Date of Admission: 09/04/2019 Date of Discharge: 09/05/2019  Attending Physician:Corrie Reder Silvestre Gunner, MD  Patient's KDT:OIZTIWP, Adam Rough, PA-C  Consults: Stroke Team   Disposition: D/C home   Follow-up Appts:  Follow-up Information    Lars Mage, PA-C Follow up in 1 week(s).   Specialty: Physician Assistant Contact information: [address] 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  The patient was at his job when he noted that the vision in his right eye began to blur peripherally, with progression to the total loss of vision peripherally and centrally.  After approximately 1 minute his central vision returned progressing to return of vision peripherally as well.    Hospital Course:  Cardioembolic ishcemic CVA - amaurosis fugax of right eye CTa neck reveals patent common carotid, internal carotid, and vertebral arteries without hemodynamically significant stenosis or dissection - CT head noted no intracranial large vessel occlusion but noted a severe focal stenosis within the inferior division of the distal M2 left MCA branch - TTE without acute findings - Stroke Team has evaluated the pt -it is felt that the  patient suffered an embolic ischemic event due to his slightly subtherapeutic INR in the setting of his mechanical heart valve -he has fully recovered symptomatically -he has been counseled extensively as to the absolute need to follow a strict vitamin K deficient diet and to monitor his INRs closely -he is advised to attempt to keep his INR consistently at 3.0 with his broad therapeutic range being 2.5-3.5 but if desired to stay within the upper end of this range -he will continue to monitor his INRs closely at home and report to his cardiology office should he have difficulty with this  Congenital bicuspid aortic valve status post mechanical aortic valve replacement 2015 No evidence of valvular dysfunction on TTE this admission and no evidence of persistent clotting/vegetation  Subtherapeutic INR Goal 2.5-3.5 -INR 2.3 at presentation -see discussion above -he is not presently on any medications that appear to have a high risk of interfering with his warfarin therapy  ADHD Resume usual home medical therapy upon discharge  Anxiety disorder/depression Continue usual BuSpar and mirtazapine  SARS-CoV-2 test positive The patient is entirely asymptomatic -records available to me via Care Everywhere confirm a positive SARS-CoV-2 test via the [facility] in May 2021 at the time of a known exposure to a positive individual  Elevated LDL LDL noted to be 144 this admission -total cholesterol 191 -given the patient's young age and the fact that this was not an atherosclerotic event I do not wish to initiate medical therapy just yet -I have counseled him on the need for exercise and strict monitoring of his diet -he is to follow-up with his cardiology team or his PCP to reassess his lipids in 6 to 8 months  Nonspecific bilateral cervical lymphadenopathy Noted incidentally on CT imaging of head/neck w/ comment "A lymphoproliferative process such as lymphoma cannot be excluded. Clinical correlation  and follow-up recommended with imaging follow-up as warranted." - clinically there are no sx to suspect lympohoma in this patient - suggest simple outpt f/u   Allergies as of 09/05/2019   No Known Allergies     Medication List    TAKE these medications   busPIRone 10 MG tablet Commonly known as: BUSPAR Take 10 mg by mouth at bedtime.   Intuniv 3 MG Tb24 Generic drug: GuanFACINE HCl Take 3 mg by mouth at bedtime.   mirtazapine 45 MG tablet Commonly known as: REMERON Take 45 mg by mouth at bedtime.   warfarin 4 MG tablet Commonly known as: COUMADIN Take 4 mg by mouth See admin instructions. Monday to Friday   warfarin 3 MG tablet Commonly known as: COUMADIN Take 3 mg by mouth See admin instructions. Saturday and Sunday       Day of Discharge BP 128/80 (BP Location: Left Arm)   Pulse 79   Temp 98 F (36.7 C) (Oral)   Resp 18   Ht 5\' 10"  (1.778 m)   Wt 100.5 kg   SpO2 97%   BMI 31.79 kg/m   Physical Exam: General: No acute respiratory distress Lungs: Clear to auscultation bilaterally without wheezes or crackles Cardiovascular: Regular rate and rhythm without murmur gallop or rub normal S1 and S2 Abdomen: Nontender, nondistended, soft, bowel sounds positive, no rebound, no ascites, no appreciable mass Extremities: No significant cyanosis, clubbing, or edema bilateral lower extremities  Basic Metabolic Panel: Recent Labs  Lab 09/04/19 1212  NA 136  K 3.7  CL 103  CO2 25  GLUCOSE 86  BUN 8  CREATININE 1.04  CALCIUM 9.1    Liver Function Tests: Recent Labs  Lab 09/04/19 1212  AST 35  ALT 43  ALKPHOS 88  BILITOT 0.8  PROT 7.3  ALBUMIN 4.6   Coags: Recent Labs  Lab 09/04/19 1212 09/05/19 0617  INR 2.3* 2.3*   CBC: Recent Labs  Lab 09/04/19 1212 09/05/19 0617  WBC 10.0 9.3  HGB 15.0 14.6  HCT 43.4 43.5  MCV 89.5 91.6  PLT 289 283   CBG: Recent Labs  Lab 09/04/19 1331  GLUCAP 78    Recent Results (from the past 240 hour(s))    SARS Coronavirus 2 by RT PCR (hospital order, performed in [facility] hospital lab) Nasopharyngeal Nasopharyngeal Swab     Status: Abnormal   Collection Time: 09/04/19  8:00 PM   Specimen: Nasopharyngeal Swab  Result Value Ref Range Status   SARS Coronavirus 2 POSITIVE (A) NEGATIVE Final    Comment: RESULT CALLED TO, READ BACK BY AND VERIFIED WITH: B BECK RN 09/04/19 2200 JDW (NOTE) SARS-CoV-2 target nucleic acids are DETECTED  SARS-CoV-2 RNA is generally detectable in upper respiratory specimens  during the acute phase of infection.  Positive results are indicative  of the presence of the identified virus, but do not rule out bacterial infection or co-infection with other pathogens not detected by the test.  Clinical correlation with patient history and  other diagnostic information is necessary to determine patient infection status.  The expected result is negative.  Fact Sheet for Patients:   09/06/19   Fact Sheet for Healthcare Providers:   BoilerBrush.com.cy    This test is not yet approved or cleared by the https://pope.com/ FDA and  has been authorized for detection and/or diagnosis  of SARS-CoV-2 by FDA under an Emergency Use Authorization (EUA).  This EUA will remain in effect (meaning this test can b e used) for the duration of  the COVID-19 declaration under Section 564(b)(1) of the Act, 21 U.S.C. section 360-bbb-3(b)(1), unless the authorization is terminated or revoked sooner.  Performed at [facility], [address] [place], [place] [address]      Time spent in discharge (includes decision making & examination of pt): 30 minutes  09/05/2019, 3:03 PM   Lonia Blood, MD [facility] Office  [phone]

## 2019-09-10 ENCOUNTER — Ambulatory Visit: Payer: BC Managed Care – PPO | Admitting: Dermatology

## 2019-09-10 ENCOUNTER — Other Ambulatory Visit: Payer: Self-pay

## 2019-09-10 ENCOUNTER — Encounter: Payer: Self-pay | Admitting: Dermatology

## 2019-09-10 DIAGNOSIS — L739 Follicular disorder, unspecified: Secondary | ICD-10-CM | POA: Diagnosis not present

## 2019-09-10 DIAGNOSIS — L700 Acne vulgaris: Secondary | ICD-10-CM | POA: Diagnosis not present

## 2019-09-10 NOTE — Patient Instructions (Addendum)
Follow-up visit for Adam Bray date of birth 01-02-1996.  Recently developed alarmingly transiently lost vision in his right eye and was told he might of had a stroke from a tiny vegetation on his valve.  His warfarin was increased to an INR of 3-3.5 which he is able to check at home.  His doctors suggested that even if there is no known effect of isotretinoin on that for now he is not to begin this.  I really do not want him on any long-term systemic antibiotics which I believe would be much more risk of affecting his INR.  Topicals by and  large are not pragmatic but perhaps the only thing available.  We discussed a brand-new prescription topical called Winlevi but I do not have enough information to feel that it is optimally effective for men; I will do more reading on this.  He is currently using Differin and CeraVe a and I suggested he look for an over-the-counter 2 to 10% benzyl peroxide cleanser both for his scalp and the areas on his face and torso.  It might be easiest if a PanOxyl bar is still made to use that on his skin and any liquid benzyl peroxide cleanser is readily available to use on the scalp. He can wet his scalp and apply this to the sores on his scalp and let it sit for 2 to 3 minutes and then wash it out and he can use any shampoo he wishes for his hair.  There is approximately 1 in a 100 chance of developing an allergy with itching from this cleanser and a high chance of bleaching his bed linens or T-shirt if he does not wash off his skin.  He is scheduled to return to see his cardiologist in September and if given clearance to be put on isotretinoin at that time he will keep a follow-up appointment with me thereafter.

## 2019-10-11 ENCOUNTER — Encounter: Payer: Self-pay | Admitting: Dermatology

## 2019-10-11 NOTE — Progress Notes (Signed)
   Follow-Up Visit   Subjective  Adam Bray is a 24 y.o. male who presents for the following: Acne (patient is currently using cerave face wash and differin gel.  these help but would like to know other options, acne is worse in the summer vs winter).  Acne, folliculitis Location: Waist to scalp Duration:  Quality:  Associated Signs/Symptoms: Modifying Factors: Coumadin therapy limits systemic options Severity:  Timing: Context:   Objective  Well appearing patient in no apparent distress; mood and affect are within normal limits.  All skin waist up examined.   Assessment & Plan   Follow-up visit for Adam Bray date of birth [birth year].  Recently developed alarmingly transiently lost vision in his right eye and was told he might of had a stroke from a tiny vegetation on his valve.  His warfarin was increased to an INR of 3-3.5 which he is able to check at home.  His doctors suggested that even if there is no known effect of isotretinoin on that for now he is not to begin this.  I really do not want him on any long-term systemic antibiotics which I believe would be much more risk of affecting his INR.  Topicals by and  large are not pragmatic but perhaps the only thing available.  We discussed a brand-new prescription topical called Winlevi but I do not have enough information to feel that it is optimally effective for men; I will do more reading on this.  He is currently using Differin and CeraVe a and I suggested he look for an over-the-counter 2 to 10% benzyl peroxide cleanser both for his scalp and the areas on his face and torso.  It might be easiest if a PanOxyl bar is still made to use that on his skin and any liquid benzyl peroxide cleanser is readily available to use on the scalp. He can wet his scalp and apply this to the sores on his scalp and let it sit for 2 to 3 minutes and then wash it out and he can use any shampoo he wishes for his hair.  There is approximately 1 in  a 100 chance of developing an allergy with itching from this cleanser and a high chance of bleaching his bed linens or T-shirt if he does not wash off his skin.  He is scheduled to return to see his cardiologist in September and if given clearance to be put on isotretinoin at that time he will keep a follow-up appointment with me thereafter.     I, Janalyn Harder, MD, have reviewed all documentation for this visit.  The documentation on 10/11/19 for the exam, diagnosis, procedures, and orders are all accurate and complete.

## 2019-11-11 ENCOUNTER — Encounter: Payer: Self-pay | Admitting: Dermatology

## 2019-11-11 ENCOUNTER — Ambulatory Visit: Payer: BC Managed Care – PPO | Admitting: Dermatology

## 2019-11-11 ENCOUNTER — Other Ambulatory Visit: Payer: Self-pay

## 2019-11-11 VITALS — Wt 221.0 lb

## 2019-11-11 DIAGNOSIS — L700 Acne vulgaris: Secondary | ICD-10-CM | POA: Diagnosis not present

## 2019-11-11 MED ORDER — ISOTRETINOIN 40 MG PO CAPS: 40.0000 mg | ORAL_CAPSULE | Freq: Every day | ORAL | 0 refills | Status: AC

## 2019-12-10 NOTE — Progress Notes (Signed)
   Isotretinoin Follow-Up Visit   Subjective  Adam Bray is a 24 y.o. male who presents for the following: Follow-up (acne some better on chest and back,  face still broke out patient has office note ok to start isotret. last labs 08/2019 in epic).  Acne follow up Location:  Duration:  Quality:  Associated Signs/Symptoms: Modifying Factors:  Severity:  Timing: Context:   The following portions of the chart were reviewed this encounter and updated as appropriate:       Objective  Well appearing patient in no apparent distress; mood and affect are within normal limits.  All skin waist up examined.  Objective  Head - Anterior (Face): Erythematous papules and pustules with comedones   Images                Assessment & Plan  Acne vulgaris Head - Anterior (Face)  Continue treatment.  Ordered Medications: ISOtretinoin (ACCUTANE) 40 MG capsule

## 2019-12-14 ENCOUNTER — Encounter: Payer: Self-pay | Admitting: Dermatology

## 2019-12-14 ENCOUNTER — Other Ambulatory Visit: Payer: Self-pay

## 2019-12-14 ENCOUNTER — Ambulatory Visit: Payer: BC Managed Care – PPO | Admitting: Dermatology

## 2019-12-14 DIAGNOSIS — Z5181 Encounter for therapeutic drug level monitoring: Secondary | ICD-10-CM | POA: Diagnosis not present

## 2019-12-14 DIAGNOSIS — L700 Acne vulgaris: Secondary | ICD-10-CM

## 2019-12-14 MED ORDER — ISOTRETINOIN 40 MG PO CAPS: 40.0000 mg | ORAL_CAPSULE | Freq: Every day | ORAL | 0 refills | Status: AC

## 2019-12-14 NOTE — Progress Notes (Signed)
SENT TO LAB

## 2019-12-15 ENCOUNTER — Encounter: Payer: Self-pay | Admitting: Dermatology

## 2019-12-15 LAB — CBC WITH DIFFERENTIAL/PLATELET
Absolute Monocytes: 447 cells/uL (ref 200–950)
Basophils Absolute: 57 cells/uL (ref 0–200)
Basophils Relative: 0.9 %
Eosinophils Absolute: 189 cells/uL (ref 15–500)
Eosinophils Relative: 3 %
HCT: 46.8 % (ref 38.5–50.0)
Hemoglobin: 15.8 g/dL (ref 13.2–17.1)
Lymphs Abs: 2066 cells/uL (ref 850–3900)
MCH: 31.2 pg (ref 27.0–33.0)
MCHC: 33.8 g/dL (ref 32.0–36.0)
MCV: 92.3 fL (ref 80.0–100.0)
MPV: 10 fL (ref 7.5–12.5)
Monocytes Relative: 7.1 %
Neutro Abs: 3541 cells/uL (ref 1500–7800)
Neutrophils Relative %: 56.2 %
Platelets: 272 10*3/uL (ref 140–400)
RBC: 5.07 10*6/uL (ref 4.20–5.80)
RDW: 13.3 % (ref 11.0–15.0)
Total Lymphocyte: 32.8 %
WBC: 6.3 10*3/uL (ref 3.8–10.8)

## 2019-12-15 LAB — COMPREHENSIVE METABOLIC PANEL
AG Ratio: 1.7 (calc) (ref 1.0–2.5)
ALT: 42 U/L (ref 9–46)
AST: 36 U/L (ref 10–40)
Albumin: 4.6 g/dL (ref 3.6–5.1)
Alkaline phosphatase (APISO): 79 U/L (ref 36–130)
BUN: 12 mg/dL (ref 7–25)
CO2: 29 mmol/L (ref 20–32)
Calcium: 10 mg/dL (ref 8.6–10.3)
Chloride: 106 mmol/L (ref 98–110)
Creat: 0.99 mg/dL (ref 0.60–1.35)
Globulin: 2.7 g/dL (calc) (ref 1.9–3.7)
Glucose, Bld: 87 mg/dL (ref 65–99)
Potassium: 4.7 mmol/L (ref 3.5–5.3)
Sodium: 140 mmol/L (ref 135–146)
Total Bilirubin: 0.6 mg/dL (ref 0.2–1.2)
Total Protein: 7.3 g/dL (ref 6.1–8.1)

## 2019-12-15 LAB — LIPID PANEL
Cholesterol: 192 mg/dL (ref <200)
HDL: 29 mg/dL — ABNORMAL LOW (ref >=40)
LDL Cholesterol (Calc): 123 mg/dL (calc) — ABNORMAL HIGH
Non-HDL Cholesterol (Calc): 163 mg/dL (calc) — ABNORMAL HIGH (ref <130)
Total CHOL/HDL Ratio: 6.6 (calc) — ABNORMAL HIGH (ref <5.0)
Triglycerides: 260 mg/dL — ABNORMAL HIGH (ref <150)

## 2019-12-15 NOTE — Progress Notes (Signed)
   Follow-Up Visit   Subjective  Adam Bray is a 24 y.o. male who presents for the following: Acne (31 days).  Acne Location: Mostly face duration:  Quality: Clear Associated Signs/Symptoms: Modifying Factors: Isotretinoin Severity:  Timing: Context:   Objective  Well appearing patient in no apparent distress; mood and affect are within normal limits.  A focused examination was performed including Head and neck.. Relevant physical exam findings are noted in the Assessment and Plan.   Assessment & Plan    Acne vulgaris (3) Head - Anterior (Face); Left Breast; Right Upper Back  Continue 40mg  daily.  Discussed target dose of minimal 120 mg/kg which will take approximately 1 year to reach.  We will consider upping daily dose in 2 months to cut back on total number of visits.  He can call me anytime if there is any problem.  ISOtretinoin (ACCUTANE) 40 MG capsule - Head - Anterior (Face), Left Breast, Right Upper Back  Other Related Procedures CBC with Differential/Platelet Comprehensive metabolic panel Lipid panel  Encounter for therapeutic drug monitoring  Other Related Procedures CBC with Differential/Platelet Comprehensive metabolic panel Lipid panel  Ordered Medications: ISOtretinoin (ACCUTANE) 40 MG capsule      I, , MD, have reviewed all documentation for this visit.  The documentation on 12/15/19 for the exam, diagnosis, procedures, and orders are all accurate and complete.

## 2020-01-21 ENCOUNTER — Other Ambulatory Visit: Payer: Self-pay

## 2020-01-21 ENCOUNTER — Ambulatory Visit: Payer: BC Managed Care – PPO | Admitting: Dermatology

## 2020-01-21 ENCOUNTER — Encounter: Payer: Self-pay | Admitting: Dermatology

## 2020-01-21 DIAGNOSIS — L700 Acne vulgaris: Secondary | ICD-10-CM | POA: Diagnosis not present

## 2020-01-21 DIAGNOSIS — Z5181 Encounter for therapeutic drug level monitoring: Secondary | ICD-10-CM

## 2020-01-21 DIAGNOSIS — L309 Dermatitis, unspecified: Secondary | ICD-10-CM

## 2020-01-21 MED ORDER — ISOTRETINOIN 40 MG PO CAPS: 40.0000 mg | ORAL_CAPSULE | Freq: Every day | ORAL | 0 refills | Status: DC

## 2020-01-21 NOTE — Patient Instructions (Signed)
Dramatic 80% improvement even on the torso after the past month of isotretinoin.  Adam Bray does have a history of previous headaches but he has had a bit of a headache almost every day for the past 2weeks.  We discussed that this was a common dose-dependent annoying side effect, but for now we will hold off on decreasing the dose because the headaches are not severe and because of renal in a prolong the overall therapy.  He does get some dermatitis on his hands from the gloves at work, and this could be exacerbated while he is on the isotretinoin.  He can look for either of 2 nonprescription moisturizers: CeraVe a cream (not lotion) or Curel.  These will be applied daily after showering and can be used after hand washing multiple times.  Should this fail, he knows that we will phone in a prescription for some triamcinolone.  I encouraged him if there are any other questions or side effects to call me anytime

## 2020-01-25 ENCOUNTER — Encounter: Payer: Self-pay | Admitting: Dermatology

## 2020-01-25 NOTE — Progress Notes (Signed)
   Follow-Up Visit   Subjective  Adam Bray is a 24 y.o. male who presents for the following: Acne (31 days follow up isotretinoin).  Acne Location: Torso more than face Duration: Years Quality: Much improved Associated Signs/Symptoms: Modifying Factors: Isotretinoin Severity:  Timing: Context:   Objective  Well appearing patient in no apparent distress; mood and affect are within normal limits.  A focused examination was performed including Head, neck, back, chest, arms, hands.. Relevant physical exam findings are noted in the Assessment and Plan.   Assessment & Plan    Acne vulgaris Right Malar Cheek  Continue isotretinoin- follow up in 31 days.  Call with any questions or problems.  ISOtretinoin (ACCUTANE) 40 MG capsule - Right Malar Cheek  Encounter for therapeutic drug monitoring  Dermatitis (2) Left Dorsal Hand; Right Dorsal Hand  Will try OTC curel or cerave mositurizing cream- if no help will call for Rx Triamcinolone 0.1% Cream  Dramatic 80% improvement even on the torso after the past month of isotretinoin.  Adam Bray does have a history of previous headaches but he has had a bit of a headache almost every day for the past 2weeks.  We discussed that this was a common dose-dependent annoying side effect, but for now we will hold off on decreasing the dose because the headaches are not severe and because of renal in a prolong the overall therapy.  He does get some dermatitis on his hands from the gloves at work, and this could be exacerbated while he is on the isotretinoin.  He can look for either of 2 nonprescription moisturizers: CeraVe a cream (not lotion) or Curel.  These will be applied daily after showering and can be used after hand washing multiple times.  Should this fail, he knows that we will phone in a prescription for some triamcinolone.  I encouraged him if there are any other questions or side effects to call me anytime    I, Janalyn Harder, MD, have  reviewed all documentation for this visit.  The documentation on 01/25/20 for the exam, diagnosis, procedures, and orders are all accurate and complete.

## 2020-02-16 ENCOUNTER — Ambulatory Visit
Admission: EM | Admit: 2020-02-16 | Discharge: 2020-02-16 | Disposition: A | Discharge status: Home / Self Care | Payer: BC Managed Care – PPO | Attending: Emergency Medicine | Admitting: Emergency Medicine

## 2020-02-16 ENCOUNTER — Other Ambulatory Visit: Payer: Self-pay

## 2020-02-16 DIAGNOSIS — J069 Acute upper respiratory infection, unspecified: Secondary | ICD-10-CM

## 2020-02-16 DIAGNOSIS — Z20822 Contact with and (suspected) exposure to covid-19: Secondary | ICD-10-CM | POA: Diagnosis not present

## 2020-02-16 MED ORDER — BENZONATATE 100 MG PO CAPS: 100.0000 mg | ORAL_CAPSULE | Freq: Three times a day (TID) | ORAL | 0 refills | Status: DC

## 2020-02-16 NOTE — ED Provider Notes (Signed)
[facility]    CSN: [id number] Arrival date & time: 02/16/20  1305      History   Chief Complaint Chief Complaint  Patient presents with  . Cough    HPI Adam Bray is a 24 y.o. male  Extensive medical history as below presenting for 2-day course of URI symptoms.  Patient provides history: Endorsing nonproductive cough with rhinorrhea and myalgias.  Has had multiple family members sick as well with similar symptoms.  May have had a Covid exposure at work.  Requesting Covid testing.  Chest pain, difficulty breathing, change in warfarin dosing, with which he reports compliance.  Past Medical History:  Diagnosis Date  . ADHD (attention deficit hyperactivity disorder)   . 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 Anxiety   . Aortic stenosis   . Constipation   . 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 Heart murmur   . Substance induced mood disorder (HCC) 10/04/2014    Patient Active Problem List   Diagnosis Date Noted  . Ischemic cerebrovascular accident (CVA) (HCC) 09/05/2019  . Ischemic embolic stroke (HCC) 09/05/2019  . Amaurosis fugax of right eye 09/04/2019  . Subtherapeutic international normalized ratio (INR) 09/04/2019  . ADHD 09/04/2019  . 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 Substance induced mood disorder (HCC) 10/04/2014  . MDD (major depressive disorder), recurrent episode, moderate (HCC) 10/04/2014    Past Surgical History:  Procedure Laterality Date  . CARDIAC SURGERY    . NASAL SEPTUM SURGERY         Home Medications    Prior to Admission medications   Medication Sig Start Date End Date Taking? Authorizing Provider  benzonatate (TESSALON) 100 MG capsule Take 1 capsule (100 mg total) by mouth every 8 (eight) hours. 02/16/20  Yes Hall-Potvin, [place], PA-C   busPIRone (BUSPAR) 10 MG tablet Take 10 mg by mouth at bedtime.     [provider]  busPIRone (BUSPAR) 15 MG tablet  09/07/19   [provider]  GuanFACINE HCl (INTUNIV) 3 MG TB24 Take 3 mg by mouth at bedtime.    [provider]  ISOtretinoin (ACCUTANE) 40 MG capsule Take 1 capsule (40 mg total) by mouth daily. 01/21/20   Janalyn Harder, MD  mirtazapine (REMERON) 45 MG tablet Take 45 mg by mouth at bedtime.    [provider]  sertraline (ZOLOFT) 50 MG tablet Take 50 mg by mouth daily. 09/07/19   [provider]  warfarin (COUMADIN) 3 MG tablet Take 3 mg by mouth See admin instructions. Saturday and Sunday    [provider]  warfarin (COUMADIN) 4 MG tablet Take 4 mg by mouth See admin instructions. Monday to Friday    [provider]    Family History Family History  Problem Relation Age of Onset  . Hirschsprung's disease Neg Hx     Social History Social History   Tobacco Use  . Smoking status: Former Smoker    Packs/day: 0.25    Types: Cigarettes    Quit date: 11/19/2017    Years since quitting: 2.2  . Smokeless tobacco: Never Used  Vaping Use  . Vaping Use: Never used  Substance Use Topics  . Alcohol use: Not Currently    Comment: occasionally  . Drug use: Never     Allergies   Patient has no known allergies.   Review of Systems Review  of Systems  Constitutional: Negative for fatigue and fever.  HENT: Positive for congestion. Negative for dental problem, ear pain, facial swelling, hearing loss, sinus pain, sore throat, trouble swallowing and voice change.   Eyes: Negative for photophobia, pain and visual disturbance.  Respiratory: Positive for cough. Negative for shortness of breath.   Cardiovascular: Negative for chest pain and palpitations.  Gastrointestinal: Negative for diarrhea and vomiting.  Musculoskeletal: Negative for arthralgias and myalgias.  Neurological: Negative for dizziness and headaches.      Physical Exam Triage Vital Signs ED Triage Vitals  Enc Vitals Group     BP 02/16/20 1553 120/72     Pulse Rate 02/16/20 1553 80     Resp 02/16/20 1553 18     Temp 02/16/20 1553 (!) 97.4 F (36.3 C)     Temp Source 02/16/20 1553 Oral     SpO2 02/16/20 1553 98 %     Weight --      Height --      Head Circumference --      Peak Flow --      Pain Score 02/16/20 1616 0     Pain Loc --      Pain Edu? --      Excl. in GC? --    No data found.  Updated Vital Signs BP 120/72   Pulse 80   Temp (!) 97.4 F (36.3 C) (Oral)   Resp 18   SpO2 98%   Visual Acuity Right Eye Distance:   Left Eye Distance:   Bilateral Distance:    Right Eye Near:   Left Eye Near:    Bilateral Near:     Physical Exam Constitutional:      General: He is not in acute distress.    Appearance: He is not toxic-appearing or diaphoretic.  HENT:     Head: Normocephalic and atraumatic.     Right Ear: Tympanic membrane and ear canal normal.     Left Ear: Tympanic membrane and ear canal normal.     Mouth/Throat:     Mouth: Mucous membranes are moist.     Pharynx: Oropharynx is clear.  Eyes:     General: No scleral icterus.    Conjunctiva/sclera: Conjunctivae normal.     Pupils: Pupils are equal, round, and reactive to light.  Neck:     Comments: Trachea midline, negative JVD Cardiovascular:     Rate and Rhythm: Normal rate and regular rhythm.  Pulmonary:     Effort: Pulmonary effort is normal. No respiratory distress.     Breath sounds: No wheezing.  Musculoskeletal:     Cervical back: Neck supple. No tenderness.  Lymphadenopathy:     Cervical: No cervical adenopathy.  Skin:    Capillary Refill: Capillary refill takes less than 2 seconds.     Coloration: Skin is not jaundiced or pale.     Findings: No rash.  Neurological:     Mental Status: He is alert and oriented to person, place, and time.      UC Treatments / Results  Labs (all labs ordered are listed, but only abnormal  results are displayed) Labs Reviewed  NOVEL CORONAVIRUS, NAA    EKG   Radiology No results found.  Procedures Procedures (including critical care time)  Medications Ordered in UC Medications - No data to display  Initial Impression / Assessment and Plan / UC Course  I have reviewed the triage vital signs and the nursing notes.  Pertinent labs & imaging results  that were available during my care of the patient were reviewed by me and considered in my medical decision making (see chart for details).     Patient afebrile, nontoxic, with SpO2 98%.  Covid PCR pending.  Patient to quarantine until results are back.  We will treat supportively as outlined below.  Return precautions discussed, patient verbalized understanding and is agreeable to plan. Final Clinical Impressions(s) / UC Diagnoses   Final diagnoses:  Encounter for screening laboratory testing for COVID-19 virus  URI with cough and congestion     Discharge Instructions     Your COVID test is pending - it is important to quarantine / isolate at home until your results are back. If you test positive and would like further evaluation for persistent or worsening symptoms, you may schedule an E-visit or virtual (video) visit throughout the [facility] app or website.  PLEASE NOTE: If you develop severe chest pain or shortness of breath please go to the ER or call 9-1-1 for further evaluation --> DO NOT schedule electronic or virtual visits for this. Please call our office for further guidance / recommendations as needed.  For information about the Covid vaccine, please visit [email or web address]    ED Prescriptions    Medication Sig Dispense Auth. Provider   benzonatate (TESSALON) 100 MG capsule Take 1 capsule (100 mg total) by mouth every 8 (eight) hours. 21 capsule Hall-Potvin, [place], PA-C     PDMP not reviewed this encounter.   Hall-Potvin, [place], [place] 02/16/20 1720

## 2020-02-16 NOTE — ED Triage Notes (Signed)
Pt c/o cough, running nose, and achy x2 days. Requesting covid testing.

## 2020-02-16 NOTE — Discharge Instructions (Signed)
Your COVID test is pending - it is important to quarantine / isolate at home until your results are back. °If you test positive and would like further evaluation for persistent or worsening symptoms, you may schedule an E-visit or virtual (video) visit throughout the [facility] app or website. ° °PLEASE NOTE: If you develop severe chest pain or shortness of breath please go to the ER or call 9-1-1 for further evaluation --> DO NOT schedule electronic or virtual visits for this. °Please call our office for further guidance / recommendations as needed. ° °For information about the Covid vaccine, please visit [email or web address] °

## 2020-02-18 LAB — NOVEL CORONAVIRUS, NAA: SARS-CoV-2, NAA: DETECTED — AB

## 2020-02-18 LAB — SARS-COV-2, NAA 2 DAY TAT

## 2020-02-25 ENCOUNTER — Ambulatory Visit: Payer: BC Managed Care – PPO | Admitting: Dermatology

## 2020-02-25 ENCOUNTER — Encounter: Payer: Self-pay | Admitting: Physician Assistant

## 2020-02-25 ENCOUNTER — Ambulatory Visit: Payer: BC Managed Care – PPO | Admitting: Physician Assistant

## 2020-02-25 ENCOUNTER — Other Ambulatory Visit: Payer: Self-pay | Admitting: Physician Assistant

## 2020-02-25 ENCOUNTER — Other Ambulatory Visit: Payer: Self-pay

## 2020-02-25 DIAGNOSIS — L700 Acne vulgaris: Secondary | ICD-10-CM

## 2020-02-25 MED ORDER — ISOTRETINOIN 40 MG PO CAPS: 40.0000 mg | ORAL_CAPSULE | Freq: Every day | ORAL | 0 refills | Status: DC

## 2020-02-25 MED FILL — MYORISAN 40 MG CAPSULE: 40 | 30 days supply | Qty: 30 | Fill #0

## 2020-02-25 NOTE — Progress Notes (Signed)
   Follow-Up Visit   Subjective  Adam Bray is a 25 y.o. male who presents for the following: Acne.   The following portions of the chart were reviewed this encounter and updated as appropriate:      Objective  Well appearing patient in no apparent distress; mood and affect are within normal limits.  All skin waist up examined.  Objective  Head - Anterior (Face): Fewer Erythematous papules and pustules with comedones, few cysts and significant PIH on entire back.   Assessment & Plan  Acne vulgaris Head - Anterior (Face)  Continue treatment and follow up in 31 days.  ISOtretinoin (ACCUTANE) 40 MG capsule - Head - Anterior (Face)    I, Oreste Majeed, PA-C, have reviewed all documentation's for this visit.  The documentation on 02/25/20 for the exam, diagnosis, procedures and orders are all accurate and complete.

## 2020-03-28 ENCOUNTER — Encounter: Payer: Self-pay | Admitting: Dermatology

## 2020-03-28 ENCOUNTER — Other Ambulatory Visit: Payer: Self-pay

## 2020-03-28 ENCOUNTER — Ambulatory Visit: Payer: BC Managed Care – PPO | Admitting: Dermatology

## 2020-03-28 DIAGNOSIS — L700 Acne vulgaris: Secondary | ICD-10-CM

## 2020-03-28 DIAGNOSIS — K130 Diseases of lips: Secondary | ICD-10-CM

## 2020-03-28 MED ORDER — ISOTRETINOIN 40 MG PO CAPS: 40.0000 mg | ORAL_CAPSULE | Freq: Every day | ORAL | 0 refills | Status: DC

## 2020-03-31 ENCOUNTER — Other Ambulatory Visit: Payer: Self-pay | Admitting: Dermatology

## 2020-03-31 ENCOUNTER — Telehealth: Payer: Self-pay | Admitting: Dermatology

## 2020-03-31 DIAGNOSIS — L700 Acne vulgaris: Secondary | ICD-10-CM

## 2020-03-31 MED ORDER — ISOTRETINOIN 40 MG PO CAPS: 40.0000 mg | ORAL_CAPSULE | Freq: Every day | ORAL | 0 refills | Status: DC

## 2020-03-31 MED FILL — MYORISAN 40 MG CAPSULE: 40 | 30 days supply | Qty: 30 | Fill #0

## 2020-03-31 NOTE — Telephone Encounter (Signed)
Left message for patient to call us back.  

## 2020-03-31 NOTE — Telephone Encounter (Signed)
Patient called back wants medicine sent to [facility]- medication sent

## 2020-03-31 NOTE — Telephone Encounter (Signed)
Patient left message on office voice mail saying that he is having trouble getting his Isotretnoin prescription.  Pharmacy told him that he is kicked out of system.  What can he do?

## 2020-03-31 NOTE — Telephone Encounter (Signed)
[facility] unable to access ipledge program- called patient to see where he wanted me to send his isotretinoin so that he can get it refilled

## 2020-04-09 ENCOUNTER — Encounter: Payer: Self-pay | Admitting: Dermatology

## 2020-04-09 NOTE — Progress Notes (Signed)
   Follow-Up Visit   Subjective  Adam Bray is a 25 y.o. male who presents for the following: Acne (31 day follow up).  Stick acne Location: Torso and face Duration:  Quality: Rheumatic improvement Associated Signs/Symptoms: Modifying Factors: Isotretinoin Severity:  Timing: Context:   Objective  Well appearing patient in no apparent distress; mood and affect are within normal limits. Objective  Head - Anterior (Face): Dramatic reduction in all inflammatory lesions, face and torso.  Patient quite pleased.  Mood is stable.  Moderate cheilitis.  Compliant with treatment.    All skin waist up examined.   Assessment & Plan    Acne vulgaris Head - Anterior (Face)  Continue current treatment.  Call with any questions or problems.  Other Related Medications ISOtretinoin (ACCUTANE) 40 MG capsule      I, Janalyn Harder, MD, have reviewed all documentation for this visit.  The documentation on 04/09/20 for the exam, diagnosis, procedures, and orders are all accurate and complete.

## 2020-04-28 ENCOUNTER — Ambulatory Visit: Payer: BC Managed Care – PPO | Admitting: Dermatology

## 2020-05-03 ENCOUNTER — Other Ambulatory Visit: Payer: Self-pay | Admitting: Physician Assistant

## 2020-05-03 ENCOUNTER — Ambulatory Visit: Payer: BC Managed Care – PPO | Admitting: Physician Assistant

## 2020-05-03 ENCOUNTER — Encounter: Payer: Self-pay | Admitting: Physician Assistant

## 2020-05-03 ENCOUNTER — Other Ambulatory Visit: Payer: Self-pay

## 2020-05-03 DIAGNOSIS — Z5181 Encounter for therapeutic drug level monitoring: Secondary | ICD-10-CM | POA: Diagnosis not present

## 2020-05-03 DIAGNOSIS — L309 Dermatitis, unspecified: Secondary | ICD-10-CM | POA: Diagnosis not present

## 2020-05-03 DIAGNOSIS — L700 Acne vulgaris: Secondary | ICD-10-CM | POA: Diagnosis not present

## 2020-05-03 MED ORDER — TRIAMCINOLONE ACETONIDE 0.1 % EX CREA: 1.0000 "application " | TOPICAL_CREAM | Freq: Every day | CUTANEOUS | 0 refills | Status: AC

## 2020-05-03 MED ORDER — ISOTRETINOIN 40 MG PO CAPS: 40.0000 mg | ORAL_CAPSULE | Freq: Every day | ORAL | 0 refills | Status: DC

## 2020-05-17 ENCOUNTER — Encounter: Payer: Self-pay | Admitting: Physician Assistant

## 2020-05-17 NOTE — Progress Notes (Signed)
   Follow-Up Visit   Subjective  Adam Bray is a 25 y.o. male who presents for the following: Acne (31 day f/u-isotretinoin).   The following portions of the chart were reviewed this encounter and updated as appropriate:  Tobacco  Allergies  Meds  Problems  Med Hx  Surg Hx  Fam Hx      Objective  Well appearing patient in no apparent distress; mood and affect are within normal limits.  All skin waist up examined.  Objective  Mid Forehead: Erythematous papules and pustules with comedones   Objective  bothj Dorsal Hands: Thin scaly erythematous papules coalescing to plaques.    Assessment & Plan  Acne vulgaris Mid Forehead  Continue isotretinoin- follow up in 31 days  Reordered Medications ISOtretinoin (ACCUTANE) 40 MG capsule  Encounter for therapeutic drug monitoring  Reordered Medications ISOtretinoin (ACCUTANE) 40 MG capsule  Dermatitis bothj Dorsal Hands  triamcinolone (KENALOG) 0.1 % - bothj Dorsal Hands    I, Ahmon Tosi, PA-C, have reviewed all documentation's for this visit.  The documentation on 05/17/20 for the exam, diagnosis, procedures and orders are all accurate and complete.

## 2020-06-06 ENCOUNTER — Ambulatory Visit: Payer: BC Managed Care – PPO | Admitting: Dermatology

## 2020-06-07 ENCOUNTER — Encounter: Payer: Self-pay | Admitting: Dermatology

## 2020-06-07 ENCOUNTER — Other Ambulatory Visit (HOSPITAL_COMMUNITY): Payer: Self-pay

## 2020-06-07 ENCOUNTER — Ambulatory Visit: Payer: BC Managed Care – PPO | Admitting: Dermatology

## 2020-06-07 ENCOUNTER — Other Ambulatory Visit: Payer: Self-pay

## 2020-06-07 DIAGNOSIS — L700 Acne vulgaris: Secondary | ICD-10-CM

## 2020-06-07 DIAGNOSIS — Z5181 Encounter for therapeutic drug level monitoring: Secondary | ICD-10-CM | POA: Diagnosis not present

## 2020-06-07 MED ORDER — ISOTRETINOIN 40 MG PO CAPS
ORAL_CAPSULE | Freq: Every day | ORAL | 0 refills | Status: DC
  Filled 2020-06-07 (×2): qty 30, 30d supply, fill #0

## 2020-06-08 ENCOUNTER — Other Ambulatory Visit (HOSPITAL_COMMUNITY): Payer: Self-pay

## 2020-06-17 ENCOUNTER — Encounter: Payer: Self-pay | Admitting: Dermatology

## 2020-06-17 NOTE — Progress Notes (Signed)
   Follow-Up Visit   Subjective  Adam Bray is a 25 y.o. male who presents for the following: Acne (31 isotretinoin f/u).  Cystic acne Location: Torso more than face Duration:  Quality:  Associated Signs/Symptoms: Modifying Factors:  Severity:  Timing: Context:   Objective  Well appearing patient in no apparent distress; mood and affect are within normal limits. Objective  Right Malar Cheek: Remarkable 80 to 90% improvement on extensive cystic truncal more than facial acne.  Expected scarring on upper back.    All skin waist up examined.   Assessment & Plan    Acne vulgaris Right Malar Cheek  Continue isotretinoin to at least reach [place] target dose of 100 mg/kg.  Emphasized need for sun protection while on this medication.  Follow-up 1 month.  Reordered Medications ISOtretinoin (ACCUTANE) 40 MG capsule  Encounter for therapeutic drug monitoring  Reordered Medications ISOtretinoin (ACCUTANE) 40 MG capsule      I, Janalyn Harder, MD, have reviewed all documentation for this visit.  The documentation on 06/17/20 for the exam, diagnosis, procedures, and orders are all accurate and complete.

## 2020-07-11 ENCOUNTER — Ambulatory Visit: Payer: BC Managed Care – PPO | Admitting: Dermatology

## 2020-07-12 ENCOUNTER — Encounter: Payer: Self-pay | Admitting: Dermatology

## 2020-07-12 ENCOUNTER — Other Ambulatory Visit (HOSPITAL_COMMUNITY): Payer: Self-pay

## 2020-07-12 ENCOUNTER — Ambulatory Visit: Payer: BC Managed Care – PPO | Admitting: Dermatology

## 2020-07-12 ENCOUNTER — Other Ambulatory Visit: Payer: Self-pay

## 2020-07-12 DIAGNOSIS — Z5181 Encounter for therapeutic drug level monitoring: Secondary | ICD-10-CM

## 2020-07-12 DIAGNOSIS — L700 Acne vulgaris: Secondary | ICD-10-CM | POA: Diagnosis not present

## 2020-07-12 MED ORDER — ISOTRETINOIN 40 MG PO CAPS
ORAL_CAPSULE | Freq: Every day | ORAL | 0 refills | Status: DC
  Filled 2020-07-12: qty 30, 30d supply, fill #0

## 2020-07-20 ENCOUNTER — Telehealth: Payer: Self-pay | Admitting: Dermatology

## 2020-07-20 ENCOUNTER — Telehealth: Payer: Self-pay

## 2020-07-20 LAB — LIPID PANEL
Cholesterol: 160 mg/dL (ref <200)
HDL: 24 mg/dL — ABNORMAL LOW (ref >=40)
LDL Cholesterol (Calc): 97 mg/dL (calc)
Non-HDL Cholesterol (Calc): 136 mg/dL (calc) — ABNORMAL HIGH (ref <130)
Total CHOL/HDL Ratio: 6.7 (calc) — ABNORMAL HIGH (ref <5.0)
Triglycerides: 270 mg/dL — ABNORMAL HIGH (ref <150)

## 2020-07-20 NOTE — Telephone Encounter (Signed)
Left message to call labs 270 watch sugar alcohol

## 2020-07-20 NOTE — Telephone Encounter (Signed)
Returning call from this morning, thinks it was for results

## 2020-07-26 ENCOUNTER — Encounter: Payer: Self-pay | Admitting: Dermatology

## 2020-07-26 NOTE — Progress Notes (Signed)
   Follow-Up Visit   Subjective  Adam Bray is a 25 y.o. male who presents for the following: Acne (31 days still clearing ).  Cystic acne Location:  Duration:  Quality: Virtually clear Associated Signs/Symptoms: Modifying Factors: Isotretinoin Severity:  Timing: Context:   Objective  Well appearing patient in no apparent distress; mood and affect are within normal limits. Objective  Head - Anterior (Face): Rheumatic 90% clearance of extensive cystic acne, torso more than face.  Moderate cheilitis.  Trying to avoid sunburns.    All skin waist up examined.   Assessment & Plan    Acne vulgaris Head - Anterior (Face)  Continue current dosing isotretinoin, will reach [place] target dose in 3 months and I suspect this will be a reasonable total dosage for Mr. Dall.  He knows he can call me with any questions or issues.  ISOtretinoin (ACCUTANE) 40 MG capsule - Head - Anterior (Face)  Lipid Panel - Head - Anterior (Face)  Encounter for therapeutic drug monitoring  Reordered Medications ISOtretinoin (ACCUTANE) 40 MG capsule      I, Janalyn Harder, MD, have reviewed all documentation for this visit.  The documentation on 07/26/20 for the exam, diagnosis, procedures, and orders are all accurate and complete.

## 2020-08-16 ENCOUNTER — Ambulatory Visit: Payer: BC Managed Care – PPO | Admitting: Dermatology

## 2020-08-16 ENCOUNTER — Other Ambulatory Visit (HOSPITAL_COMMUNITY): Payer: Self-pay

## 2020-08-16 ENCOUNTER — Encounter: Payer: Self-pay | Admitting: Dermatology

## 2020-08-16 ENCOUNTER — Other Ambulatory Visit: Payer: Self-pay

## 2020-08-16 DIAGNOSIS — L700 Acne vulgaris: Secondary | ICD-10-CM

## 2020-08-16 DIAGNOSIS — Z5181 Encounter for therapeutic drug level monitoring: Secondary | ICD-10-CM

## 2020-08-16 MED ORDER — ISOTRETINOIN 40 MG PO CAPS
ORAL_CAPSULE | Freq: Every day | ORAL | 0 refills | Status: DC
  Filled 2020-08-16: qty 30, fill #0
  Filled 2020-08-16: qty 30, 30d supply, fill #0

## 2020-09-03 ENCOUNTER — Encounter: Payer: Self-pay | Admitting: Dermatology

## 2020-09-03 NOTE — Progress Notes (Signed)
   Isotretinoin Follow-Up Visit   Subjective  Adam Bray is a 25 y.o. male who presents for the following: Acne (Patient here today for 31 day follow up.).  Follow-up isotretinoin Location:  Duration:  Quality: Virtually clear Associated Signs/Symptoms: Modifying Factors:  Severity:  Timing: Context:   The following portions of the chart were reviewed this encounter and updated as appropriate:  Tobacco  Allergies  Meds  Problems  Med Hx  Surg Hx  Fam Hx        Objective  Well appearing patient in no apparent distress; mood and affect are within normal limits.  All skin waist up examined.  Head - Anterior (Face) Few small residual inflammatory papules, no cystic lesions.  Back is remarkably improved.  No sunburns.  Normal mood.   Assessment & Plan  Acne vulgaris Head - Anterior (Face)  Continue isotretinoin.  Ultraviolet protective.  Call with any questions or issues.  Recheck 1 month.  ISOtretinoin (ACCUTANE) 40 MG capsule - Head - Anterior (Face) TAKE 1 CAPSULE BY MOUTH ONCE A DAY  Encounter for therapeutic drug monitoring  Related Medications ISOtretinoin (ACCUTANE) 40 MG capsule TAKE 1 CAPSULE BY MOUTH ONCE A DAY

## 2020-09-19 ENCOUNTER — Other Ambulatory Visit (HOSPITAL_COMMUNITY): Payer: Self-pay

## 2020-09-19 ENCOUNTER — Other Ambulatory Visit: Payer: Self-pay

## 2020-09-19 ENCOUNTER — Ambulatory Visit: Payer: BC Managed Care – PPO | Admitting: Dermatology

## 2020-09-19 DIAGNOSIS — Z5181 Encounter for therapeutic drug level monitoring: Secondary | ICD-10-CM

## 2020-09-19 DIAGNOSIS — L700 Acne vulgaris: Secondary | ICD-10-CM

## 2020-09-19 MED ORDER — ISOTRETINOIN 40 MG PO CAPS
ORAL_CAPSULE | Freq: Every day | ORAL | 0 refills | Status: DC
  Filled 2020-09-19: qty 30, 30d supply, fill #0

## 2020-09-21 ENCOUNTER — Other Ambulatory Visit (HOSPITAL_COMMUNITY): Payer: Self-pay

## 2020-10-05 ENCOUNTER — Encounter: Payer: Self-pay | Admitting: Dermatology

## 2020-10-05 NOTE — Progress Notes (Signed)
   Follow-Up Visit   Subjective  Adam Bray is a 25 y.o. male who presents for the following: Acne (Here for isotretinoin follow up.).  Cystic acne Location:  Duration:  Quality:  Associated Signs/Symptoms: Modifying Factors: History of known Severity:  Timing: Context:   Objective  Well appearing patient in no apparent distress; mood and affect are within normal limits. Head - Anterior (Face) Adam Bray and I continue to be quite pleased with his remarkable cystic acne clearing, of the torso as well as the face.  No deep cysts, few superficial inflammatory papules.  No similar appearance side effects with isotretinoin beyond the expected cheilitis.  Normal mood.  No recent sunburns.    All skin waist up examined.   Assessment & Plan    Acne vulgaris Head - Anterior (Face)  Will reach [place] target dose of 120 mg/kg in 2 months and this should be Adam Bray target as well.  He knows that he can call me with any issues that may arise.  ISOtretinoin (ACCUTANE) 40 MG capsule - Head - Anterior (Face) TAKE 1 CAPSULE BY MOUTH ONCE A DAY  Encounter for therapeutic drug monitoring  Related Medications ISOtretinoin (ACCUTANE) 40 MG capsule TAKE 1 CAPSULE BY MOUTH ONCE A DAY      I, Janalyn Harder, MD, have reviewed all documentation for this visit.  The documentation on 10/05/20 for the exam, diagnosis, procedures, and orders are all accurate and complete.

## 2020-10-25 ENCOUNTER — Ambulatory Visit: Payer: BC Managed Care – PPO | Admitting: Dermatology

## 2020-10-25 ENCOUNTER — Encounter: Payer: Self-pay | Admitting: Dermatology

## 2020-10-25 ENCOUNTER — Other Ambulatory Visit (HOSPITAL_COMMUNITY): Payer: Self-pay

## 2020-10-25 ENCOUNTER — Other Ambulatory Visit: Payer: Self-pay

## 2020-10-25 DIAGNOSIS — L700 Acne vulgaris: Secondary | ICD-10-CM | POA: Diagnosis not present

## 2020-10-25 DIAGNOSIS — Z5181 Encounter for therapeutic drug level monitoring: Secondary | ICD-10-CM | POA: Diagnosis not present

## 2020-10-25 MED ORDER — ISOTRETINOIN 40 MG PO CAPS
ORAL_CAPSULE | Freq: Every day | ORAL | 0 refills | Status: AC
  Filled 2020-10-25: qty 30, 30d supply, fill #0

## 2020-10-29 ENCOUNTER — Encounter: Payer: Self-pay | Admitting: Dermatology

## 2020-10-29 ENCOUNTER — Other Ambulatory Visit (HOSPITAL_COMMUNITY): Payer: Self-pay

## 2020-10-29 NOTE — Progress Notes (Signed)
   Follow-Up Visit   Subjective  Adam Bray is a 25 y.o. male who presents for the following: Acne (Follow up 31 days ).  Cystic acne Location:  Duration:  Quality:  Associated Signs/Symptoms: Modifying Factors:  Severity:  Timing: Context:   Objective  Well appearing patient in no apparent distress; mood and affect are within normal limits. Left Malar Cheek, Right Malar Cheek Cystic acne is essentially 100% clear.  There is some small inflammatory papules on the upper arms which may represent simple folliculitis.  Moderate cheilitis.  Normal mood.    All skin waist up examined.   Assessment & Plan    Acne vulgaris Left Malar Cheek; Right Malar Cheek  This will be the final prescription for isotretinoin.  I encouraged Adam Bray to call me if there are any future questions or flares.  Otherwise follow-up can be on a as needed basis.  ISOtretinoin (ACCUTANE) 40 MG capsule - Left Malar Cheek, Right Malar Cheek TAKE 1 CAPSULE BY MOUTH ONCE A DAY  Encounter for therapeutic drug monitoring  Related Medications ISOtretinoin (ACCUTANE) 40 MG capsule TAKE 1 CAPSULE BY MOUTH ONCE A DAY      I, Adam Harder, MD, have reviewed all documentation for this visit.  The documentation on 10/29/20 for the exam, diagnosis, procedures, and orders are all accurate and complete.

## 2021-07-07 ENCOUNTER — Other Ambulatory Visit: Payer: Self-pay | Admitting: Registered Nurse

## 2021-07-07 DIAGNOSIS — E010 Iodine-deficiency related diffuse (endemic) goiter: Secondary | ICD-10-CM

## 2021-07-11 ENCOUNTER — Ambulatory Visit
Admission: RE | Admit: 2021-07-11 | Discharge: 2021-07-11 | Disposition: A | Discharge status: Home / Self Care | Payer: BC Managed Care – PPO | Source: Ambulatory Visit | Attending: Registered Nurse | Admitting: Registered Nurse

## 2021-07-11 DIAGNOSIS — E010 Iodine-deficiency related diffuse (endemic) goiter: Secondary | ICD-10-CM

## 2022-11-30 ENCOUNTER — Other Ambulatory Visit: Payer: Self-pay | Admitting: Physical Medicine and Rehabilitation

## 2022-11-30 DIAGNOSIS — K1120 Sialoadenitis, unspecified: Secondary | ICD-10-CM

## 2022-12-14 ENCOUNTER — Ambulatory Visit
Admission: RE | Admit: 2022-12-14 | Discharge: 2022-12-14 | Disposition: A | Discharge status: Home / Self Care | Payer: BC Managed Care – PPO | Source: Ambulatory Visit | Attending: Physical Medicine and Rehabilitation | Admitting: Physical Medicine and Rehabilitation

## 2022-12-14 DIAGNOSIS — K1120 Sialoadenitis, unspecified: Secondary | ICD-10-CM

## 2022-12-14 MED ORDER — IOPAMIDOL (ISOVUE-300) INJECTION 61%
75.0000 mL | Freq: Once | INTRAVENOUS | Status: AC | PRN
  Administered 2022-12-14: 75 mL via INTRAVENOUS

## 2023-03-20 ENCOUNTER — Other Ambulatory Visit: Payer: Self-pay | Admitting: Physician Assistant

## 2023-03-20 ENCOUNTER — Ambulatory Visit
Admission: RE | Admit: 2023-03-20 | Discharge: 2023-03-20 | Disposition: A | Discharge status: Home / Self Care | Payer: BC Managed Care – PPO | Source: Ambulatory Visit | Attending: Physician Assistant | Admitting: Physician Assistant

## 2023-03-20 ENCOUNTER — Other Ambulatory Visit: Payer: BC Managed Care – PPO

## 2023-03-20 DIAGNOSIS — R2232 Localized swelling, mass and lump, left upper limb: Secondary | ICD-10-CM
